# Patient Record
Sex: Female | Born: 1965 | Race: White | Hispanic: No | Marital: Married | Smoking: Former smoker
Health system: Southern US, Community
[De-identification: ages and names within clinical notes are randomized; demographics above are authoritative.]

## PROBLEM LIST (undated history)

## (undated) DIAGNOSIS — F419 Anxiety disorder, unspecified: Secondary | ICD-10-CM

## (undated) DIAGNOSIS — F32A Depression, unspecified: Secondary | ICD-10-CM

## (undated) DIAGNOSIS — K219 Gastro-esophageal reflux disease without esophagitis: Secondary | ICD-10-CM

## (undated) DIAGNOSIS — I10 Essential (primary) hypertension: Secondary | ICD-10-CM

## (undated) DIAGNOSIS — M1990 Unspecified osteoarthritis, unspecified site: Secondary | ICD-10-CM

## (undated) DIAGNOSIS — T7840XA Allergy, unspecified, initial encounter: Secondary | ICD-10-CM

## (undated) HISTORY — PX: WISDOM TOOTH EXTRACTION: SHX21

## (undated) HISTORY — DX: Gastro-esophageal reflux disease without esophagitis: K21.9

## (undated) HISTORY — DX: Allergy, unspecified, initial encounter: T78.40XA

## (undated) HISTORY — DX: Anxiety disorder, unspecified: F41.9

## (undated) HISTORY — DX: Depression, unspecified: F32.A

---

## 1998-10-21 ENCOUNTER — Other Ambulatory Visit: Admission: RE | Admit: 1998-10-21 | Discharge: 1998-10-21 | Payer: Self-pay | Admitting: Obstetrics and Gynecology

## 1999-03-10 ENCOUNTER — Other Ambulatory Visit: Admission: RE | Admit: 1999-03-10 | Discharge: 1999-03-10 | Payer: Self-pay | Admitting: Obstetrics and Gynecology

## 1999-06-07 ENCOUNTER — Encounter (INDEPENDENT_AMBULATORY_CARE_PROVIDER_SITE_OTHER): Payer: Self-pay

## 1999-06-07 ENCOUNTER — Inpatient Hospital Stay (HOSPITAL_COMMUNITY): Admission: AD | Admit: 1999-06-07 | Discharge: 1999-06-09 | Payer: Self-pay | Admitting: Obstetrics and Gynecology

## 1999-07-10 ENCOUNTER — Other Ambulatory Visit: Admission: RE | Admit: 1999-07-10 | Discharge: 1999-07-10 | Payer: Self-pay | Admitting: Obstetrics and Gynecology

## 1999-11-15 ENCOUNTER — Other Ambulatory Visit: Admission: RE | Admit: 1999-11-15 | Discharge: 1999-11-19 | Payer: Self-pay | Admitting: Psychiatry

## 1999-11-20 ENCOUNTER — Inpatient Hospital Stay (HOSPITAL_COMMUNITY): Admission: AD | Admit: 1999-11-20 | Discharge: 1999-11-23 | Payer: Self-pay | Admitting: Psychiatry

## 2000-05-27 ENCOUNTER — Emergency Department (HOSPITAL_COMMUNITY): Admission: EM | Admit: 2000-05-27 | Discharge: 2000-05-28 | Payer: Self-pay | Admitting: Emergency Medicine

## 2002-10-16 ENCOUNTER — Ambulatory Visit (HOSPITAL_COMMUNITY): Admission: RE | Admit: 2002-10-16 | Discharge: 2002-10-16 | Payer: Self-pay

## 2002-10-16 ENCOUNTER — Encounter: Payer: Self-pay | Admitting: *Deleted

## 2004-01-22 ENCOUNTER — Inpatient Hospital Stay (HOSPITAL_COMMUNITY): Admission: EM | Admit: 2004-01-22 | Discharge: 2004-01-26 | Payer: Self-pay | Admitting: Emergency Medicine

## 2004-02-09 ENCOUNTER — Ambulatory Visit: Payer: Self-pay | Admitting: Family Medicine

## 2004-02-15 ENCOUNTER — Ambulatory Visit: Payer: Self-pay | Admitting: Family Medicine

## 2004-02-15 ENCOUNTER — Ambulatory Visit: Payer: Self-pay | Admitting: *Deleted

## 2004-02-22 ENCOUNTER — Ambulatory Visit: Payer: Self-pay | Admitting: Family Medicine

## 2004-03-09 ENCOUNTER — Ambulatory Visit: Payer: Self-pay | Admitting: Internal Medicine

## 2004-04-25 ENCOUNTER — Ambulatory Visit: Payer: Self-pay | Admitting: Family Medicine

## 2004-04-27 ENCOUNTER — Ambulatory Visit: Payer: Self-pay | Admitting: Family Medicine

## 2004-05-02 ENCOUNTER — Ambulatory Visit: Payer: Self-pay | Admitting: Family Medicine

## 2004-05-18 ENCOUNTER — Ambulatory Visit: Payer: Self-pay | Admitting: Family Medicine

## 2004-05-25 ENCOUNTER — Ambulatory Visit: Payer: Self-pay | Admitting: Family Medicine

## 2004-06-06 ENCOUNTER — Ambulatory Visit: Payer: Self-pay | Admitting: Family Medicine

## 2004-06-27 ENCOUNTER — Ambulatory Visit: Payer: Self-pay | Admitting: Family Medicine

## 2005-10-05 ENCOUNTER — Inpatient Hospital Stay (HOSPITAL_COMMUNITY): Admission: AD | Admit: 2005-10-05 | Discharge: 2005-10-05 | Payer: Self-pay | Admitting: Gynecology

## 2005-10-05 ENCOUNTER — Ambulatory Visit: Payer: Self-pay | Admitting: Obstetrics and Gynecology

## 2005-10-10 ENCOUNTER — Inpatient Hospital Stay (HOSPITAL_COMMUNITY): Admission: EM | Admit: 2005-10-10 | Discharge: 2005-10-13 | Payer: Self-pay | Admitting: Obstetrics and Gynecology

## 2005-10-10 ENCOUNTER — Ambulatory Visit: Payer: Self-pay | Admitting: Gynecology

## 2006-01-28 ENCOUNTER — Emergency Department (HOSPITAL_COMMUNITY): Admission: EM | Admit: 2006-01-28 | Discharge: 2006-01-28 | Payer: Self-pay | Admitting: Emergency Medicine

## 2006-06-21 ENCOUNTER — Emergency Department (HOSPITAL_COMMUNITY): Admission: EM | Admit: 2006-06-21 | Discharge: 2006-06-22 | Payer: Self-pay | Admitting: Emergency Medicine

## 2006-07-01 ENCOUNTER — Emergency Department (HOSPITAL_COMMUNITY): Admission: EM | Admit: 2006-07-01 | Discharge: 2006-07-02 | Payer: Self-pay | Admitting: *Deleted

## 2007-03-05 ENCOUNTER — Emergency Department (HOSPITAL_COMMUNITY): Admission: EM | Admit: 2007-03-05 | Discharge: 2007-03-06 | Payer: Self-pay | Admitting: Emergency Medicine

## 2007-07-01 ENCOUNTER — Inpatient Hospital Stay (HOSPITAL_COMMUNITY): Admission: EM | Admit: 2007-07-01 | Discharge: 2007-07-03 | Payer: Self-pay | Admitting: Emergency Medicine

## 2007-07-03 ENCOUNTER — Ambulatory Visit: Payer: Self-pay | Admitting: Psychiatry

## 2007-07-19 ENCOUNTER — Inpatient Hospital Stay (HOSPITAL_COMMUNITY): Admission: EM | Admit: 2007-07-19 | Discharge: 2007-07-22 | Payer: Self-pay | Admitting: Emergency Medicine

## 2008-04-12 ENCOUNTER — Ambulatory Visit (HOSPITAL_COMMUNITY): Admission: RE | Admit: 2008-04-12 | Discharge: 2008-04-12 | Payer: Self-pay | Admitting: Family Medicine

## 2008-12-10 ENCOUNTER — Inpatient Hospital Stay (HOSPITAL_COMMUNITY): Admission: AD | Admit: 2008-12-10 | Discharge: 2008-12-12 | Payer: Self-pay | Admitting: Obstetrics and Gynecology

## 2008-12-28 ENCOUNTER — Emergency Department (HOSPITAL_COMMUNITY): Admission: EM | Admit: 2008-12-28 | Discharge: 2008-12-28 | Payer: Self-pay | Admitting: Emergency Medicine

## 2009-01-03 ENCOUNTER — Ambulatory Visit: Payer: Self-pay | Admitting: Psychiatry

## 2009-01-03 ENCOUNTER — Inpatient Hospital Stay (HOSPITAL_COMMUNITY): Admission: EM | Admit: 2009-01-03 | Discharge: 2009-01-07 | Payer: Self-pay | Admitting: Psychiatry

## 2009-01-03 ENCOUNTER — Emergency Department (HOSPITAL_COMMUNITY): Admission: EM | Admit: 2009-01-03 | Discharge: 2009-01-04 | Payer: Self-pay | Admitting: Emergency Medicine

## 2009-08-07 ENCOUNTER — Emergency Department (HOSPITAL_COMMUNITY): Admission: EM | Admit: 2009-08-07 | Discharge: 2009-08-08 | Payer: Self-pay | Admitting: Emergency Medicine

## 2010-06-05 LAB — BASIC METABOLIC PANEL
Chloride: 106 mEq/L (ref 96–112)
GFR calc Af Amer: >60 mL/min (ref >60)
Glucose, Bld: 216 mg/dL — ABNORMAL HIGH (ref 70–99)
Potassium: 3.6 mEq/L (ref 3.5–5.1)

## 2010-06-05 LAB — DIFFERENTIAL
Basophils Absolute: 0 10*3/uL (ref 0.0–0.1)
Eosinophils Absolute: 0 10*3/uL (ref 0.0–0.7)
Eosinophils Relative: 1 % (ref 0–5)
Monocytes Absolute: 0.4 10*3/uL (ref 0.1–1.0)
Neutrophils Relative %: 70 % (ref 43–77)

## 2010-06-05 LAB — CBC
HCT: 38.7 % (ref 36.0–46.0)
MCHC: 33.3 g/dL (ref 30.0–36.0)
Platelets: 190 10*3/uL (ref 150–400)

## 2010-06-22 LAB — CBC
HCT: 38.8 % (ref 36.0–46.0)
HCT: 42.9 % (ref 36.0–46.0)
HCT: 45.2 % (ref 36.0–46.0)
Hemoglobin: 15.5 g/dL — ABNORMAL HIGH (ref 12.0–15.0)
MCHC: 33.7 g/dL (ref 30.0–36.0)
MCHC: 34 g/dL (ref 30.0–36.0)
MCHC: 34.4 g/dL (ref 30.0–36.0)
MCV: 96.8 fL (ref 78.0–100.0)
MCV: 97.3 fL (ref 78.0–100.0)
MCV: 95.9 fL (ref 78.0–100.0)
MCV: 97.1 fL (ref 78.0–100.0)
Platelets: 95 10*3/uL — ABNORMAL LOW (ref 150–400)
Platelets: 238 10*3/uL (ref 150–400)
RBC: 3.99 MIL/uL (ref 3.87–5.11)
RDW: 12.5 % (ref 11.5–15.5)
RDW: 12.6 % (ref 11.5–15.5)
WBC: 9.1 10*3/uL (ref 4.0–10.5)
WBC: 20 10*3/uL — ABNORMAL HIGH (ref 4.0–10.5)

## 2010-06-22 LAB — COMPREHENSIVE METABOLIC PANEL
ALT: 36 U/L — ABNORMAL HIGH (ref 0–35)
AST: 51 U/L — ABNORMAL HIGH (ref 0–37)
Albumin: 3.7 g/dL (ref 3.5–5.2)
Alkaline Phosphatase: 159 U/L — ABNORMAL HIGH (ref 39–117)
BUN: 8 mg/dL (ref 6–23)
BUN: 6 mg/dL (ref 6–23)
Calcium: 9.1 mg/dL (ref 8.4–10.5)
Calcium: 8.4 mg/dL (ref 8.4–10.5)
Chloride: 100 mEq/L (ref 96–112)
Chloride: 95 mEq/L — ABNORMAL LOW (ref 96–112)
Creatinine, Ser: 0.48 mg/dL (ref 0.4–1.2)
Glucose, Bld: 105 mg/dL — ABNORMAL HIGH (ref 70–99)
Potassium: 3.9 mEq/L (ref 3.5–5.1)
Sodium: 138 mEq/L (ref 135–145)
Sodium: 131 mEq/L — ABNORMAL LOW (ref 135–145)
Total Bilirubin: 1.5 mg/dL — ABNORMAL HIGH (ref 0.3–1.2)
Total Bilirubin: 0.9 mg/dL (ref 0.3–1.2)
Total Protein: 6.9 g/dL (ref 6.0–8.3)
Total Protein: 7.8 g/dL (ref 6.0–8.3)

## 2010-06-22 LAB — DIFFERENTIAL
Basophils Absolute: 0 10*3/uL (ref 0.0–0.1)
Basophils Relative: 0 % (ref 0–1)
Basophils Relative: 0 % (ref 0–1)
Eosinophils Absolute: 0.3 10*3/uL (ref 0.0–0.7)
Eosinophils Absolute: 0 10*3/uL (ref 0.0–0.7)
Eosinophils Relative: 0 % (ref 0–5)
Lymphocytes Relative: 22 % (ref 12–46)
Lymphocytes Relative: 13 % (ref 12–46)
Monocytes Absolute: 0.3 10*3/uL (ref 0.1–1.0)
Monocytes Relative: 3 % (ref 3–12)
Neutrophils Relative %: 64 % (ref 43–77)

## 2010-06-22 LAB — BASIC METABOLIC PANEL
BUN: 3 mg/dL — ABNORMAL LOW (ref 6–23)
CO2: 27 mEq/L (ref 19–32)
Chloride: 101 mEq/L (ref 96–112)
Creatinine, Ser: 0.42 mg/dL (ref 0.4–1.2)

## 2010-06-22 LAB — URINALYSIS, ROUTINE W REFLEX MICROSCOPIC
Bilirubin Urine: NEGATIVE
Specific Gravity, Urine: 1.009 (ref 1.005–1.030)
Urobilinogen, UA: 0.2 mg/dL (ref 0.0–1.0)

## 2010-06-22 LAB — RAPID URINE DRUG SCREEN, HOSP PERFORMED
Barbiturates: NOT DETECTED
Cocaine: NOT DETECTED
Opiates: NOT DETECTED
Opiates: NOT DETECTED
Tetrahydrocannabinol: NOT DETECTED

## 2010-06-22 LAB — ETHANOL
Alcohol, Ethyl (B): 411 mg/dL (ref 0–10)
Alcohol, Ethyl (B): 402 mg/dL (ref 0–10)

## 2010-06-22 LAB — URINE MICROSCOPIC-ADD ON

## 2010-06-22 LAB — PREGNANCY, URINE: Preg Test, Ur: NEGATIVE

## 2010-06-23 LAB — CBC
HCT: 33.1 % — ABNORMAL LOW (ref 36.0–46.0)
Hemoglobin: 11.4 g/dL — ABNORMAL LOW (ref 12.0–15.0)
MCHC: 34.6 g/dL (ref 30.0–36.0)
MCHC: 34.7 g/dL (ref 30.0–36.0)
MCV: 99.7 fL (ref 78.0–100.0)
Platelets: 96 10*3/uL — ABNORMAL LOW (ref 150–400)
RBC: 3.17 MIL/uL — ABNORMAL LOW (ref 3.87–5.11)
RDW: 13.3 % (ref 11.5–15.5)

## 2010-08-01 NOTE — H&P (Signed)
NAMERECHY, BOST              ACCOUNT NO.:  [id number]   MEDICAL RECORD NO.:  [id number]          PATIENT TYPE:  EMS   LOCATION:  ED                           FACILITY:  [facility]   PHYSICIAN:  Eduard Clos, MDDATE OF BIRTH:  07/12/1965   DATE OF ADMISSION:  07/01/2007  DATE OF DISCHARGE:                              HISTORY & PHYSICAL   History obtained from patient, ER physician and medical records.   CHIEF COMPLAINT:  The patient was brought in because he was unconscious.   HISTORY OF PRESENT ILLNESS:  A 45 year old female with a known history  of chronic alcoholism and cigarette smoking, who was found in a motel  unconscious and was brought into the ER by the police.  Once the patient  was in the ER, the patient started regaining consciousness, but was  still confused and fatigued, so was admitted for further observation.  The patient in the ER had a CAT scan of the head noted and chest x-rays  done, which are all within acceptable limits and they do not show  anything acute.  The patient at this time is completely oriented with no  focal deficits.  She states that she usually lives with her family and  her husband, but had gone to a motel for which she is not able to give  an exact explanation why she went to the motel.  Presently denies any  headache, denies any weakness of limbs, chest pain, shortness of breath,  abdominal pain, nausea, vomiting.  Denies any fever or chills,  discharges or diarrhea.   PAST MEDICAL HISTORY:  1. Chronic alcoholism.  2. Cigarette smoking.   PAST SURGICAL HISTORY:  None.   MEDICATIONS PRIOR TO ADMISSION:  None.  The patient states she was  prescribed some antidepressant but is off those medications and does not  have any suicidal ideations.   ALLERGIES:  SULFA.   SOCIAL HISTORY:  The patient states she lives with her husband.  She  smokes cigarettes, drinks alcohol.  Denies any illegal drug abuse.  Had  been advised strongly to  quit smoking and drinking alcohol.   FAMILY HISTORY:  Nothing contributory.   REVIEW OF SYSTEMS:  As per history of present illness.  Nothing else  significant.   PHYSICAL EXAMINATION:  GENERAL:  The patient examined at bedside.  Not  in acute distress.  She is alert, awake and oriented to time, place and  person.  VITAL SIGNS:  Blood pressure 101/70, pulse 90 per minute, temperature  98.5, respirations 15 per minute.  O2 sat 96%.  HEENT:  Anicteric.  No pallor.  PERRLA positive.  SKIN:  There is no obvious dermatic lesion seen.  CHEST:  Bilateral air present.  No rhonchi.  HEART:  S1-S2 heard.  ABDOMEN:  Soft, nontender.  Bowel sounds heard.  No guarding, no  rigidity, no discoloration.  CNS:  Alert, awake, oriented to time, place and person.  MOTOR:  Upper and lower extremities 5/5.  EXTREMITIES:  Peripheral pulses felt.  No edema.   LABORATORY DATA:  Chest x-ray, nothing acute.  CAT scan of  the head,  nothing acute.  EKG; normal sinus rhythm with no acute ST-T changes.  Tox screen negative.  Urine pregnancy negative.  CBC - WBC 6.8,  hemoglobin 14, hematocrit 41.5, platelets 157, neutrophils 68%.  PT/INR  12.9 and 1.  CMP; sodium 138, potassium 3.9, chloride 101, carbon  dioxide 24, glucose 95, BUN 2, creatinine 0.49, AST 49, ALT 33, alkaline  phosphatase 88, cold agglutinin 6.7, calcium 8.4, albumin 4.  Urine  pregnancy negative.  Urine tox negative.  Acetaminophen level less than  10.  Salicylates less than 4.  Alcohol 492.  UA negative for ketones,  blood, protein, leukocytes.   ASSESSMENT:  1. Altered mental status secondary to alcohol, essentially resolved.  2. Chronic history of alcoholism and cigarette smoking.  3. History of depression, off medication with no suicidal ideation.   PLAN:  Admission to [facility] to medical floor.  Will place the  patient on Ativan for alcohol withdrawal protocol, thiamine IV and folic  acid IV.  Will start on a heart-healthy  diet.  Will follow up cardiac  enzymes, check TSH  and lipid panel.  Will observe the patient for 24  hours.  Will get a social service consult.  Also substance abuse  counseling along with tobacco cessation counseling.      Eduard Clos, MD  Electronically Signed     ANK/MEDQ  D:  07/01/2007  T:  07/01/2007  Job:  [phone]

## 2010-08-01 NOTE — Discharge Summary (Signed)
NAMEALAYAH, KNOUFF              ACCOUNT NO.:  [id number]   MEDICAL RECORD NO.:  [id number]          PATIENT TYPE:  INP   LOCATION:  1503                         FACILITY:  [facility]   PHYSICIAN:  Hettie Holstein, D.O.    DATE OF BIRTH:  12-08-1965   DATE OF ADMISSION:  07/01/2007  DATE OF DISCHARGE:  07/03/2007                               DISCHARGE SUMMARY   PRIMARY CARE PHYSICIAN:  [facility].   FINAL DIAGNOSIS:  Alteration in mental status, secondary to alcohol.   SECONDARY DIAGNOSES:  1. Chronic alcohol abuse and tobacco abuse.  2. History of depression; no suicidal ideation.   MEDICATIONS ON DISCHARGE:  The patient should resume her prior  medication with Prozac and also initiate Librium 25 mg q.i.d. -- only as  needed for agitation, anxiety (dispense #20; and a daily multivitamin.   DISPOSITION:  At present, Ms. Chapa is stable without suicidal  ideation, status post inpatient psychiatry evaluation; who strongly  recommended inpatient detoxification.  However, Ms. Philson has  continued to decline.   At this juncture, she is felt to be stable for discharge.   HISTORY OF PRESENTING ILLNESS:  For full details please refer to the H&P  as dictated by Dr. Toniann Fail.  The patient is a 45 year old female with  known history of chronic alcoholism and tobacco abuse.  She was found in  a motel unconscious and was brought to the emergency department by  police.  Once the patient arrived in the ER, she started regaining  consciousness.  She had some confusion, was fatigued.  She was admitted  for further observation.  In the emergency department a CT had been  performed, as well as chest x-rays -- which were all within normal  limits.  In any event, she was noted to be completely oriented without  focal deficits upon hospitalist assessment.   HOSPITAL COURSE:  The patient was admitted, placed on withdrawal  precautions and a psychiatric evaluation was sought.  She was seen in  consultation by Dr. Electa Sniff; for full details and recommendations, refer  to his full consultative note.  Again, inpatient detox was recommended,  but Mrs. Moye declined.  We are requesting social worker to provide  her with all the necessary information, to get on a program for alcohol  dependency.  She states that she has every intent of pursuing this in  the outpatient setting.   LABORATORY STUDIES:  (Most recent to discharge) Urine culture was  negative.  TSH was 1.527.  Lipid profile revealed:  Total cholesterol  188, LDL of 80, HDL 98.  Urine pregnancy test was negative.  Urine drug  screen was also negative.  Alcohol level upon arrival was 492 mg/dL      Hettie Holstein, D.O.  Electronically Signed     ESS/MEDQ  D:  07/03/2007  T:  07/03/2007  Job:  [id number]

## 2010-08-01 NOTE — H&P (Signed)
[name], [name]              ACCOUNT NO.:  [id number]   MEDICAL RECORD NO.:  [id number]          PATIENT TYPE:  INP   LOCATION:  1223                         FACILITY:  [facility]   PHYSICIAN:  Della Goo, M.D. DATE OF BIRTH:  1966-01-01   DATE OF ADMISSION:  07/19/2007  DATE OF DISCHARGE:                              HISTORY & PHYSICAL   DATE OF ADMISSION:  Jul 19, 2007   PRIMARY CARE PHYSICIAN:  Unassigned.   CHIEF COMPLAINT:  Right arm shaking.   HISTORY OF PRESENT ILLNESS:  This is a 45 year old female with a history  of heavy alcoholism presenting to the emergency department with  complaints of alcohol withdrawal and increased right arm shaking.  The  patient reports that her right arm shakes and tremors only when she is  in alcohol withdrawal.  She reports drinking heavily and has done so for  approximately 8-9 years.  She reports drinking beers and does not  remember how many.  She only reports that she drinks too much.  The  patient does state that she wants to stop drinking.  In the emergency  department the patient was evaluated and found to have an alcohol level  at 485.  Her speech was hesitant but clear and not dysarthric.   PAST MEDICAL HISTORY:  None other than alcoholism.   MEDICATIONS:  None.   PAST SURGICAL HISTORY:  None.   ALLERGIES:  To SULFA.   SOCIAL HISTORY:  The patient reports smoking cigarettes occasionally and  the alcohol history has been heavy.  She reports starting to drink about  8 years ago after the birth of her last child.  She is married and has  three children.  Her last child lives with her mother.   FAMILY HISTORY:  The patient denies any hypertension, diabetes, coronary  artery disease or cancer in her family.  She also denies any alcoholism  in her family that she knows of.   PHYSICAL EXAMINATION FINDINGS:  This is a thin 45 year old, well-  nourished, well-developed female in no discomfort or acute distress.  VITAL SIGNS:   Temperature 98.0, blood pressure 136/102, heart rate 98,  respirations 20, O2 saturation 99%.  HEENT:  Normocephalic, atraumatic.  Pupils are reactive to light  bilaterally.  Extraocular muscles are intact.  There is no scleral  icterus.  Unable to visualize fundi.  Oropharynx is dry oral mucosa with  white tongue exudate.  NECK:  Supple full range of motion.  No thyromegaly, adenopathy,  jugulovenous distention.  CARDIOVASCULAR:  Regular rate and rhythm.  No murmurs, gallops or rubs.  LUNGS:  Clear to auscultation bilaterally.  ABDOMEN:  Positive bowel sounds, soft, nontender, nondistended.  There  is no hepatosplenomegaly on examination.  EXTREMITIES:  Without cyanosis, clubbing or edema.  NEUROLOGIC EXAMINATION:  Nonfocal.   LABORATORY STUDIES:  White blood cell count 4.9, hemoglobin 14.8,  hematocrit 41.6, platelets 56.  Sodium 125, potassium 3.4, chloride 90,  bicarb 24, BUN 1.0, creatinine 0.56 and glucose 126.  Urinalysis  negative.  Urine pregnancy test negative.  Urine drug screen negative.   ASSESSMENT:  A  45 year old female being admitted with:  1. Alcohol intoxication.  2. Alcoholism.  3. Hyponatremia.  4. Thrombocytopenia secondary to #2   PLAN:  The patient will be admitted to a step-down ICU area for cardiac  and pulmonary monitoring.  IV fluids have been ordered and her  electrolytes will be monitored.  The patient will be placed on the  Librium alcohol withdrawal protocol.  P.o. and IV p.r.n. Ativan has been  ordered as well.  Liver function tests will be checked.  DVT and GI  prophylaxis have been ordered in this patient.  Behavioral  Health/psychiatric consultations will also be placed while the patient  is hospitalized; also, case management.      Della Goo, M.D.  Electronically Signed     HJ/MEDQ  D:  07/20/2007  T:  07/20/2007  Job:  [id number]
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
## 2010-08-01 NOTE — Discharge Summary (Signed)
NAMEEMILENE, [name]              ACCOUNT NO.:  [id number]   MEDICAL RECORD NO.:  [id number]          PATIENT TYPE:  INP   LOCATION:  1515                         [facility]   PHYSICIAN:  Altha Harm, MDDATE OF BIRTH:  1965/04/01   DATE OF ADMISSION:  07/19/2007  DATE OF DISCHARGE:  07/22/2007                               DISCHARGE SUMMARY   DISCHARGE DISPOSITION:  Home.   FINAL DISCHARGE DIAGNOSES:  1. Delirium tremens.  2. Alcoholism.  3. Hyponatremia.  4. Pancytopenia.   DISCHARGE MEDICATIONS:  1. Librium 25 mg p.o. daily.  2. Potassium 40 mEq p.o. daily.  3. Thiamine 100 mg p.o. daily.  4. Folate 1 mg p.o. daily.   CONSULTANTS:  None.   PROCEDURES:  None.   DIAGNOSTIC STUDIES:  CT head without contrast done on admission which  shows no acute intracranial pathology.   LABORATORY STUDIES:  At the time of discharge, the patient has a white  blood cell count of 2.9, hemoglobin of 12.4, hematocrit of 35.3,  platelet count of 44,  which has been stable.   CODE STATUS:  Full code.   ALLERGIES:  SULFA.   PRIMARY CARE PHYSICIAN:  The patient does not have a primary care  physician at this time.   CHIEF COMPLAINT:  Right arm shaking.   HISTORY OF PRESENT ILLNESS:  Please see the H&P dictated by Dr. Lovell Sheehan  for details of the HPI; however, in short, this is a 45 year old female  with a history of heavy alcoholism presenting with alcohol withdrawal.   HOSPITAL COURSE:  The patient was admitted and monitored in a step-down  setting.  Subsequent to that, the patient was started on Librium  protocol and is completing the end of her Librium protocol for alcohol  detoxification.   The patient has been counseled on the need for alcohol abstinence and  states that while she is willing to abstain from alcohol and to follow  with an AA program, she is not willing to go into inpatient rehab at  this time and is requesting discharge.   Pancytopenia:  The  patient has had a pancytopenia which has been  persistent throughout her hospital stay.  Under the care of Dr. Olena Leatherwood,  she had a discussion with her.  It was determined by Dr. Olena Leatherwood that  the pancytopenia was secondary to her alcoholism, and no further  investigations were pursued in the hospital.  I have discussed with the  patient that she needs to abstain from alcohol and that she should  follow up with a primary care physician of her choice to further  investigate the pancytopenia and to follow with her blood counts.   Hypokalemia:  The patient had a low potassium while hospitalized,  which  is being replenished orally.  The patient is being discharged home on  potassium supplementation.  Her magnesium was not checked while she was  in the hospital; however, I would suspect that the patient's magnesium  is also low.  However, again, the patient is requesting discharge, and,  given the fact that she is clinically stable, I will  not hold her in the  hospital just to check a magnesium level.  I am going to go ahead and  put her on magnesium supplementation with mag oxide 400 mg p.o. b.i.d.  given her alcoholism and ask her to check with a primary care physician  to further check her electrolytes.   At the time of discharge, the patient is alert and oriented x3.  She has  no signs of withdrawal at this time.  Temperature is 98.2, heart rate  69,  blood pressure 107/70, and O2 sats are 96% on room air with a  respiratory rate of 18.   DIETARY RESTRICTIONS:  The patient has no dietary restrictions, and she  has been counseled to abstain from alcohol use.   PHYSICAL RESTRICTIONS:  None.   FOLLOWUP:  The patient should follow up with a primary care physician of  her choice to further investigate her pancytopenia and to follow her  electrolytes.      Altha Harm, MD  Electronically Signed     MAM/MEDQ  D:  07/22/2007  T:  07/22/2007  Job:  [id number]

## 2010-08-04 NOTE — Op Note (Signed)
[name], [name]              ACCOUNT NO.:  [id number]   MEDICAL RECORD NO.:  [id number]          PATIENT TYPE:  INP   LOCATION:  0454                         [facility]AC[facility]   PHYSICIAN:  Petra Kuba, M.D.    DATE OF BIRTH:  06-12-1965   DATE OF PROCEDURE:  01/23/2004  DATE OF DISCHARGE:                                 OPERATIVE REPORT   PROCEDURE:  Esophagogastroduodenoscopy.   INDICATIONS:  Upper GI bleeding. Consent was signed after risks, benefits,  methods, and options thoroughly discussed last night and before any sedate  given today.   MEDICINES USED:  Demerol 80, Versed 9.   PROCEDURE NOTE:  The video endoscope was inserted by direct vision. Proximal  and mid esophagus were normal. In the distal esophagus was a small hiatal  hernia and a well healed probably Mallory-Weiss tear versus GE junction  linear ulceration. There was some friability at the GE junction but no signs  of active bleeding. Scope passed into the stomach and advanced through a  normal antrum and pylorus into the duodenal bulb pertinent for minimal  bulbitis and around C loop to a normal second portion of the duodenum. No  blood was seen distally. As we withdrew back to the bulb, the C loop did  have some friability, but with washing and suctioning, no underlying lesion  was seen and no active bleeding was seen once the fresh blood from probable  scope trauma was washed off. Scope was withdrawn back to the stomach and  retroflexed. The angularis, cardia, fundus, and lesser and greater curve  were normal on retroflexed visualization. Straight visualization of the  stomach did not reveal any additional findings. Scope was slowly withdrawn  back through her esophagus which confirmed above findings. The GE was also  washed and watched and could not be made to bleed. It did have the  appearance actually of more like a healed Mallory-Weiss tear. We went ahead  and repeated the endoscopy in total, just to  make sure no other lesions were  missed, and none were seen. Air and water were suctioned from the stomach.  Scope was removed. The patient tolerated the procedure well. There was no  obvious or immediate complication.   ENDOSCOPIC DIAGNOSES:  1.  Small hiatal hernia with a well healed Mallory-Weiss tear versus      gastroesophageal junction linear ulceration.  2.  Mild bulbitis and friability.  3.  Otherwise normal esophagogastroduodenoscopy.   PLAN:  Okay to leave unit. Pump inhibitors. Alcohol rehab. Re-EGD p.r.n.  Slowly advance diet. If her belly pain continues, consider an ultrasound. No  aspirin or nonsteroidals long term.      MEM/MEDQ  D:  01/23/2004  T:  01/23/2004  Job:  [id number]

## 2010-08-04 NOTE — Discharge Summary (Signed)
[facility]  Patient:    [name], [name]                       MRN: [id number] Adm. Date:  [id number] Disc. Date: [id number] Attending:  Marlyn Corporal Fabmy                           Discharge Summary  ADMISSION DIAGNOSES: Axis I:    1. Major depression, single episode.            2. Alcohol dependence. Axis II:   Deferred. Axis III:  Status post childbirth five months ago. Axis IV:   Severe (primary support group and social environment). Axis V:    Global Assessment of Functioning 35 on admission; highest in the            past year being 75.  DISCHARGE DIAGNOSES: Axis I:    1. Major depression, single episode.            2. Alcohol dependence. Axis II:   Deferred. Axis III:  Status post childbirth five months ago. Axis IV:   Severe (related to primary support group and social environment). Axis V:    Global Assessment of Functioning 35 on admission, 75 on discharge.  BRIEF HISTORY:  The patient is a 45 year old married Caucasian female who provides a history of depression and episodic alcohol abuse.  At the time of her admission, the patient had a blood alcohol level of 445 mg%.  On admission, the patient endorsed symptoms of depression including anhedonia, difficulty concentrating, lethargy, episodic suicidal thinking without plan and difficulty coping with everyday activities.  Sleep had been poor and she has troubled early morning awakenings.  She also has been anorexic and has lost several pounds in weight.  Given the magnitude of her depression and her escalating alcohol abuse, patient was admitted for detoxification and management of her depression.  PERTINENT PHYSICAL AND LABORATORY DATA:  Physical examination was done at the emergency room prior to her transfer here.  Thyroid studies were unremarkable.  COURSE IN HOSPITAL:  The patient was detoxified uneventfully on a Librium protocol.  She did not show evidence of either major or minor  withdrawal symptoms.  Patient had been on Prozac for a week without side effects and the dose of the Prozac was increased to 40 mg daily.  Wellbutrin was added after obtaining informed consent.  During the course of her hospitalization, I talk to her at length about her history of binge drinking over the last two months as it relates to her depression.  In turn, the patient stated that she had her baby and, two weeks later, she had her kitchen and living room redecorated and her drinking spiraled out of control because of the stress associated with living in her current space.  By the time of her discharge, her depression was resolving.  CONDITION ON DISCHARGE:  Patient was optimally improved, fully detoxified without complications and she was eating and sleeping well.  Her relationship with her husband had improved and she was much more optimistic about their future together.  Her thinking was clear and coherent and her affect was full.  MEDICATION AND FOLLOW-UP:  Patient was discharged with prescriptions for Wellbutrin SR 150 mg q.12h., Prozac 40 mg daily and Restoril 30 mg q.h.s. p.r.n.  She was advised not to drink or use drugs.  She was to follow with AA. An appointment was made  for her at the [facility] outpatient clinic to begin November 24, 1999. DD:  12/25/99 TD:  12/25/99 Job: [id number]

## 2010-08-04 NOTE — H&P (Signed)
NAMEGUINEVERE, STEPHENSON              ACCOUNT NO.:  [id number]   MEDICAL RECORD NO.:  [id number]          PATIENT TYPE:  INP   LOCATION:  0163                         FACILITY:  [facility]   PHYSICIAN:  Gertha Calkin, M.D.DATE OF BIRTH:  12-May-1965   DATE OF ADMISSION:  01/22/2004  DATE OF DISCHARGE:                                HISTORY & PHYSICAL   Patient is unassigned patient.   CHIEF COMPLAINT:  Coffee-grounds emesis last night and hematemesis.   PRESENT ILLNESS:  This is a 45 year old Caucasian female who has  longstanding history of alcoholism, tobacco use and probable history of  depression who states that she started throwing up last night bright red  blood.  She states that she has been up all night having emesis  approximately every hour.  This morning she was dropped off by her husband  and while in the ER has mostly been dry heaves but also some coffee-grounds  emesis was present.  She states that she has been lightheaded ever since her  first few episodes last night.  She has not had any syncopal spells and has  lots of nausea.  She denies any previous similar symptoms.  She states that  her last alcoholic beverage was back Thursday evening.   PAST MEDICAL HISTORY:  Has not been followed by a primary care physician.  She does admit to alcohol use for approximately 15 years on and off and  tobacco use for approximately 4 years on and off.  She states she is smoking  a little less than half a pack a day currently.  She does state that she has  had blackouts before as well as had one DUI related to alcoholism.   FAMILY HISTORY:  Mother is alive at 73 with known hypercholesterolemia but  no known other medical issues.  She does have some siblings who are also  without any known medical issues.   SOCIAL HISTORY:  She is married but now estranged as of 2 days ago.  She  does have two kids ages 4-1/2 and 34 both daughters.  She states that she  has currently been kicked out  of her house by her husband and been told that  he will be filing a restraining order coming this Monday.   MEDICATIONS:  Only on Ortho Tri-Cyclen.   DRUG ALLERGIES:  She is allergic to SULFA which causes her to itch.   REVIEW OF SYSTEMS:  Positive for alcoholism, lightheadedness, hematemesis  and melanotic stools currently and in the past.  She states that she does  has been having depressive episodes and has been on several antidepressant  medication trials but has not continued for financial reasons.  Otherwise  she denies any fevers, chills, cough, sinus allergies, headaches, visual  changes.  She denies problems with chest pain/shortness of breath.  No  constipation/diarrhea.  No lower extremity swelling.  Other review of  systems negative.   PHYSICAL EXAMINATION:  VITAL SIGNS:  Temperature is 97.8, pulse is 71,  respirations 20, blood pressure 105/81.  Her lying blood pressure 123/93,  sitting was 117/79, and she refused standing  as she was starting to feel  lightheaded.  Her heart rate appropriately bumped from 106 to 141 from lying  to standing.  GENERAL:  In general this is an ill-appearing white female in mild distress.  She seems very tearful.  HEENT:  Some dried blood in her mouth, otherwise no cuts or bruises.  Tongue  without lacerations.  Oropharynx is without exudate.  Pupils equal and round  reactive to light.  Extraocular movements are intact.  NECK:  Supple without masses.  No JVP.  CHEST:  Clear to auscultation bilaterally with good air movement.  HEART:  Regular rhythm but tachycardic.  No appreciable murmurs, rubs, or  gallops.  ABDOMEN:  Soft, nontender, nondistended, without rebound or rigidity.  Positive bowel sounds.  No organomegaly appreciated.  EXTREMITIES:  No clubbing, cyanosis, or edema.  SKIN:  Without rashes or lesions.  NEUROLOGIC:  She is alert and oriented x3, cranial nerves are intact, and  she has no gross motor deficits.   LABORATORIES:   She has a white count of 15.7, hemoglobin 11.2, platelets of  250, MCV of 101.6.  Her sodium is 135, potassium of 3, chloride of 93,  bicarb of 26, glucose 191, BUN of 29 and creatinine of 0.9.  Her LFTs are  all within normal range.  Alcohol level is less than 5.  Gastric occult was  positive for blood and lipase and amylase levels were negative.   ASSESSMENT AND PLAN:  Problem 1. UPPER GASTROINTESTINAL BLEED.  We will ask  gastroenterology for consultation regarding EGD.  In the meantime we will  type and screen 2 units, fluid resuscitate her with normal saline, add a  proton pump inhibitor and antiemetics as needed, will be admitted to step  down for close observation at least for the next 23 hours.   Problem 2. ALCOHOL ABUSE.  Will write for a banana bag and have given as  needed orders for Ativan with any changes that would be consistent with  withdrawal symptoms.   Problem 3. HYPOKALEMIA.  We will replete.   Problem 4. HYPERGLYCEMIA.  We will follow for now.   Problem 5. TOBACCO USE/ABUSE.  We will offer counseling for cessation.   Problem 6. DEPRESSION.  We will get case manager involved to help patient  with finances in regards to long-term antidepressant medications.   Problem 7. SOCIAL CIRCUMSTANCES.  Given her current situation at home, we  will ask care manager to help with available resources in this situation.  She does state that she has family members but they are all in [place] and currently has no resources to I guess contact them.      JD/MEDQ  D:  01/22/2004  T:  01/22/2004  Job:  [id number]

## 2010-08-04 NOTE — Discharge Summary (Signed)
[name], [name]              ACCOUNT NO.:  [id number]   MEDICAL RECORD NO.:  [id number]          PATIENT TYPE:  INP   LOCATION:  0369                         FACILITY:  [facility]   PHYSICIAN:  Jonna L. Robb Matar, M.D.DATE OF BIRTH:  04-Jun-1965   DATE OF ADMISSION:  01/22/2004  DATE OF DISCHARGE:  01/26/2004                                 DISCHARGE SUMMARY   PRIMARY CARE PHYSICIAN:  [facility].   FINAL DIAGNOSES:  1.  Upper gastrointestinal bleed secondary to a Mallory-Weiss tear.  2.  Anemia secondary to blood loss.  3.  Hypokalemia.  4.  Thrombocytopenia secondary to alcoholism.  5.  Depressive disorder, not otherwise specified.   CONSULTATIONS:  1.  Gastroenterology, Dr. Ewing Schlein.  2.  Psychiatry, Dr. Jeanie Sewer.   PROCEDURE:  Esophagogastroduodenoscopy on November 6.   ALLERGIES:  SULFA.   CODE STATUS:  Full.   HISTORY:  This 45 year old Caucasian female with chronic alcoholism and  depression began to throw up bright red blood every hour.  Developed  lightheadedness without syncope.  She has had alcohol problems off and on  for 15 years.  Previous black outs and one DUI related to alcoholism.  Two  days ago, she was estranged from her husband.  She has been kicked out and  will be filing a restraining order.   PHYSICAL EXAMINATION:  VITAL SIGNS:  Initially showed a blood pressure of  123/93 lying down and 105/81 standing up.  Heart rate went from 106 to 141  with orthostatics.  GENERAL:  She appeared chronically ill and tearful.  HEENT:  Dried blood in the mouth.  ABDOMEN:  Nontender.  Nondistended.  No tremors.   Initial white count was 15.7, elevated MCV.  Potassium 3.0.  BUN 29.  Creatinine 0.9.   HOSPITAL COURSE:  The patient was hydrated, transfused 2 units of blood.  She was seen by Dr. Ewing Schlein, who put her on IV PPIs and did the EGD.  The  patient was repleted with potassium, magnesium, thiamine, vitamins, and  gradually improved.  Her platelet count was 105  and came up to 117.  Her  initial hemoglobin was 7.6.  It came up to 10.8 after transfusion.   Case manager and psychiatry were involved with the patient.  Dr. Jeanie Sewer  __________ .  I suggested she is a good candidate to start her off with  Remeron, Alcoholic's Anonymous, and eventually possibly Effexor.   DISPOSITION:  Patient will be discharged on omeprazole 20 mg b.i.d.,  Centrum, Remeron 7.5 mg q.h.s. x2 days, 15 q.h.s. x2 days, then 22.5 q.h.s.  Ativan 1 mg up to t.i.d. p.r.n.  She will be going with her mother to visit  a place to stay and then stay with her mother for two weeks.  When she comes  back, she is to get involved in AA.  She will have an appointment with both  [facility] and [facility].      JLB/MEDQ  D:  01/26/2004  T:  01/26/2004  Job:  [id number]   cc:   [facility]   [facility]

## 2010-08-04 NOTE — H&P (Signed)
[facility]  Patient:    [name], [name]                       MRN: [id number] Adm. Date:  [id number] Attending:  Marlyn Corporal Fabmy Dictator:   Johnella Moloney, N.P.                   Psychiatric Admission Assessment  IDENTIFYING INFORMATION:  Ms. Luedke is a 45 year old white female, admitted November 20, 1999, on a voluntary basis after drinking a fifth of vodka.  She was seen at [facility] Emergency Department and when going in she had a blood alcohol level of 445.  HISTORY OF PRESENT ILLNESS:  The patient is here for detox and also treatment of depression.  She has been drinking on and off, she said, for about two months.  She apparently is a binge drinker and drinks maybe two to three times a week.  The patient is somewhat vague and it is difficult to get a real clear picture from her.  She says she goes some for weeks i.e., three weeks when she does not drink.  She did start drinking yesterday, November 19, 1999, and probably drank about a fifth of vodka.  Her last drink was November 19, 1999. She probably consumed the fifth of liquor in about four to five hours.  She states that she apparently has been trying to hide the drinking from her husband.  Apparently, according to the patient, she is very depressed and drinking to treat the depression.  Apparently her husband recognized that she was intoxicated and took her to the emergency department.  The patient is vague as to what is going on.  She has been very depressed for five months, since the birth of her child five months ago.  She currently denies suicidal ideation, denies homicidal ideation.  Sleep has been poor.  Has trouble falling asleep with early morning awakening.  Appetite has been poor for one month.  She states she has had a hard time since she had her baby five months ago.  Her grandmother also died 06/11/01and she states that her house was being renovated about two or three  weeks after she had the baby and she was stuck in one room and could not use the kitchen.  She states her husband is a marital stress due to the drinking, that her husband just does not understand. She states, however, they plan to stay together.  She has been crying a lot, decreased energy, anhedonia.  She states she has a couple of friends but has not talked to them recently.  She states she has had trouble adjusting to the new baby, and two weeks after she delivered is when the kitchen was torn apart and renovation began, and again she complains of being cooped up in one room. She states that she did attend an AA meeting yesterday.  However, afterwards she got upset with her husband because he would not go to church with her and she did go to church with her mom, but it was too late and the service had ended.  She then apparently consumed a fifth or more of alcohol.  Her husband apparently reported that she had been sneaking into the bathroom drinking.  PAST PSYCHIATRIC HISTORY:  The patient denies any previous inpatient treatment.  She has seen Dr. Jodi Marble on one occasion.  She was on Zoloft for one month as prescribed by her OB/GYN doctor  who suspected postpartum depression.  That was stopped last week and she was started on a new medication.  She did see a counselor by the name of Jerral Bonito on two occasions.  She goes to AA occasionally.  PAST MEDICAL HISTORY:  The patient has no primary care doctor, goes to Urgent Care when she needs something.  She does have an OB/GYN doctor, Dr. Rana Snare.  She denies medical problems.  CURRENT MEDICATIONS: 1. Prozac 20 mg q.a.m. which was started last week. 2. Micronor birth control pills at bedtime.  DRUG ALLERGIES:  SULFA which makes her itch.  SOCIAL HISTORY:  The patient has been married for 10 years.  This is her first marriage.  She has two children, a 62-year-old daughter and a 73-month-old daughter.  Her parents lives in [place].  She has  one sister.  She states she is close to her family.  She completed college and graduated from [employer] with a BS in elementary education.  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  She denies any financial problems.  Her husband works.  FAMILY HISTORY:  None.  ALCOHOL AND DRUG HISTORY:  The patient states that she has been drinking since she was a teenager; however, she does not consider that she was a problem drinker up until two months ago and then she describes herself as a binge drinker now.  She denies substance abuse.  She states she smokes a half a pack of cigarettes a day and she has only been doing this for two weeks.  PHYSICAL EXAMINATION:  Positive physical findings:  Please see physical examination done at [facility] ED on November 19, 1999, and November 20, 1999.  Again, her blood alcohol level was 445.  Her white blood count was 11.4, hemoglobin 13.9.  Sodium was increased at 146, potassium increased at 3.2. She was given IVs at [facility] ED.  CURRENT MENTAL STATUS EXAMINATION:  Appearance:  A casually dressed, thin, Caucasian female.  Cooperative.  Poor eye contact.  Speech very low.  There is like a spontaneity, however, it is relevant when she does answer the questions. Mood:  Crying, very sad.  Affect is depressed.  She denies suicidal ideation.  She denies homicidal ideation.  Thought processes are logical and coherent without evidence of psychosis.  No hallucinations.  No signs or symptoms of alcohol withdrawal.  Cognitively she is alert and oriented.  Her cognitive of function appears to be intact.  CURRENT DIAGNOSES: Axis I:    Major depression, single episode and alcohol dependence. Axis II:   Deferred. Axis III:  Status post childbirth five months ago. Axis IV:   Severe related to problems with primary support group, social            environment and her alcohol abuse problem.  Also the            birth of her daughter five months ago. Axis V:     Current global assessment of functioning is 35, highest            in the past year 75.  CURRENT TREATMENT PLAN AND RECOMMENDATION:  Voluntary admission to [facility] Unit.  Our goal will be to maintain her safety.  Check q.15 minutes and have her contract for safety.  We will continue her Prozac 20 mg one p.o. q.a.m. for her depression and she may use her own supply of Micronor birth control pills.  Her family plans  to bring them tonight.  We will put her on a low-dose Librium protocol for now.  This might be reevaluated later.  She states she has never had DTs, seizures, or black outs.  Also we will force fluids i.e. Gatorade.  She is to attend the dual diagnose group.  We will do a thyroid profile on her.  All other lab work was done at [facility] ED.  Also schedule a family session with the patient and her husband regarding her history and what has been going on for the last five months.  TENTATIVE LENGTH AND DISCHARGE PLAN:  Three days. DD:  11/20/99 TD:  11/22/99 Job: [id number]

## 2010-08-04 NOTE — Consult Note (Signed)
[name], [name]              ACCOUNT NO.:  [id number]   MEDICAL RECORD NO.:  [id number]          PATIENT TYPE:  INP   LOCATION:  1610                         FACILITY:  [facility]   PHYSICIAN:  Petra Kuba, M.D.    DATE OF BIRTH:  1966/02/09   DATE OF CONSULTATION:  01/22/2004  DATE OF DISCHARGE:                                   CONSULTATION   HISTORY:  The patient is seen at the request of the [facility] doctors for  upper GI bleeding.  She has a moderate-to-long history of alcohol abuse and  has increased her consumption of late with about a 12 hour history of  vomiting some blood, started on Friday, went on to the early morning and  Saturday.  She has seen some dark stools but not any diarrhea.  Had some  vague abdominal pains, has not been on any aspirin, nonsteroidals, has not  had any previous GI problems or work-ups before. Her past medical history is  essentially negative.  She has not had any operations.  Her medicines are  birth control pills only.   ALLERGIES:  SULFA.   SOCIAL HISTORY:  Smokes and drinks but denies drug use and denies much over-  the-counter medicine use.   FAMILY HISTORY:  Negative for any obvious ulcers or GI problems.   REVIEW OF SYSTEMS:  Negative except above.   PHYSICAL EXAMINATION:  VITAL SIGNS:  See chart.  GENERAL:  No acute distress, lying comfortably in the bed.  ABDOMEN:  Soft, nontender.   LABORATORY DATA:  Pertinent for normal coags and platelets with a hemoglobin  of 11.2 which is decreased to 9 with hydration, MCV 101, BUN 29, creatinine  0.8.  Liver tests are fairly unremarkable, but her total bili of 2.5,  albumin 3.6.   ASSESSMENT:  1.  Upper gastrointestinal bleeding.  2.  Alcohol abuse.   PLAN:  The risks, benefits, methods of endoscopy were discussed.  We will  proceed p.r.n. signs of active bleeding or in the a.m.  I have discussed the  procedure with her, and she agrees.      MEM/MEDQ  D:  01/22/2004  T:   01/23/2004  Job:  [id number]   cc:   [facility] Team

## 2010-12-12 LAB — PROTIME-INR
INR: 1
Prothrombin Time: 12.9

## 2010-12-12 LAB — DIFFERENTIAL
Lymphocytes Relative: 28
Lymphs Abs: 1.9
Monocytes Relative: 4
Neutro Abs: 4.6
Neutrophils Relative %: 68

## 2010-12-12 LAB — URINE CULTURE: Colony Count: NO GROWTH

## 2010-12-12 LAB — URINALYSIS, ROUTINE W REFLEX MICROSCOPIC
Bilirubin Urine: NEGATIVE
Ketones, ur: NEGATIVE
Nitrite: NEGATIVE
Protein, ur: NEGATIVE

## 2010-12-12 LAB — LIPID PANEL
Cholesterol: 188
LDL Cholesterol: 80
VLDL: 10

## 2010-12-12 LAB — CK TOTAL AND CKMB (NOT AT ARMC)
CK, MB: 1.5
Total CK: 91

## 2010-12-12 LAB — COMPREHENSIVE METABOLIC PANEL
ALT: 33
Calcium: 8.4
Creatinine, Ser: 0.49
Glucose, Bld: 95
Sodium: 138
Total Protein: 6.7

## 2010-12-12 LAB — TSH: TSH: 1.527

## 2010-12-12 LAB — SALICYLATE LEVEL: Salicylate Lvl: <4

## 2010-12-12 LAB — ACETAMINOPHEN LEVEL: Acetaminophen (Tylenol), Serum: <10 — ABNORMAL LOW

## 2010-12-12 LAB — RAPID URINE DRUG SCREEN, HOSP PERFORMED
Barbiturates: NOT DETECTED
Opiates: NOT DETECTED

## 2010-12-12 LAB — APTT: aPTT: 28

## 2010-12-12 LAB — CBC
Hemoglobin: 14
MCHC: 33.7
MCV: 96.9
RDW: 13.2

## 2010-12-22 LAB — I-STAT 8, (EC8 V) (CONVERTED LAB)
Acid-base deficit: 1
BUN: <3 — ABNORMAL LOW
Bicarbonate: 23.3
HCT: 43
Hemoglobin: 14.6
Operator id: 294521
Sodium: 131 — ABNORMAL LOW
TCO2: 24

## 2010-12-22 LAB — CBC
MCV: 99.6
Platelets: 142 — ABNORMAL LOW
RDW: 11.3 — ABNORMAL LOW
WBC: 9

## 2010-12-22 LAB — RAPID URINE DRUG SCREEN, HOSP PERFORMED
Amphetamines: NOT DETECTED
Benzodiazepines: NOT DETECTED
Cocaine: NOT DETECTED
Tetrahydrocannabinol: NOT DETECTED

## 2010-12-22 LAB — DIFFERENTIAL
Basophils Absolute: 0
Basophils Relative: 0
Eosinophils Absolute: 0 — ABNORMAL LOW
Lymphs Abs: 2.1
Neutrophils Relative %: 74

## 2011-12-24 ENCOUNTER — Other Ambulatory Visit (HOSPITAL_COMMUNITY): Payer: Self-pay | Admitting: Obstetrics and Gynecology

## 2011-12-24 DIAGNOSIS — Z1231 Encounter for screening mammogram for malignant neoplasm of breast: Secondary | ICD-10-CM

## 2012-01-09 ENCOUNTER — Ambulatory Visit (HOSPITAL_COMMUNITY)
Admission: RE | Admit: 2012-01-09 | Discharge: 2012-01-09 | Disposition: A | Discharge status: Home / Self Care | Payer: Self-pay | Source: Ambulatory Visit | Attending: Obstetrics and Gynecology | Admitting: Obstetrics and Gynecology

## 2012-01-09 DIAGNOSIS — Z1231 Encounter for screening mammogram for malignant neoplasm of breast: Secondary | ICD-10-CM

## 2013-01-13 ENCOUNTER — Other Ambulatory Visit (HOSPITAL_COMMUNITY): Payer: Self-pay | Admitting: Obstetrics and Gynecology

## 2013-01-13 DIAGNOSIS — Z1231 Encounter for screening mammogram for malignant neoplasm of breast: Secondary | ICD-10-CM

## 2013-02-04 ENCOUNTER — Ambulatory Visit (HOSPITAL_COMMUNITY)
Admission: RE | Admit: 2013-02-04 | Discharge: 2013-02-04 | Disposition: A | Discharge status: Home / Self Care | Payer: Medicaid Other | Source: Ambulatory Visit | Attending: Obstetrics and Gynecology | Admitting: Obstetrics and Gynecology

## 2013-02-04 DIAGNOSIS — Z1231 Encounter for screening mammogram for malignant neoplasm of breast: Secondary | ICD-10-CM

## 2014-03-03 ENCOUNTER — Other Ambulatory Visit: Payer: Self-pay | Admitting: Obstetrics and Gynecology

## 2014-03-04 LAB — CYTOLOGY - PAP

## 2015-12-07 ENCOUNTER — Other Ambulatory Visit: Payer: Self-pay | Admitting: Obstetrics and Gynecology

## 2015-12-07 DIAGNOSIS — Z1231 Encounter for screening mammogram for malignant neoplasm of breast: Secondary | ICD-10-CM

## 2015-12-16 ENCOUNTER — Ambulatory Visit
Admission: RE | Admit: 2015-12-16 | Discharge: 2015-12-16 | Disposition: A | Discharge status: Home / Self Care | Payer: No Typology Code available for payment source | Source: Ambulatory Visit | Attending: Obstetrics and Gynecology | Admitting: Obstetrics and Gynecology

## 2015-12-16 DIAGNOSIS — Z1231 Encounter for screening mammogram for malignant neoplasm of breast: Secondary | ICD-10-CM

## 2016-01-26 ENCOUNTER — Other Ambulatory Visit: Payer: Self-pay | Admitting: Obstetrics and Gynecology

## 2016-01-26 DIAGNOSIS — Z1231 Encounter for screening mammogram for malignant neoplasm of breast: Secondary | ICD-10-CM

## 2016-01-27 ENCOUNTER — Ambulatory Visit (HOSPITAL_COMMUNITY)
Admission: RE | Admit: 2016-01-27 | Discharge: 2016-01-27 | Disposition: A | Discharge status: Home / Self Care | Payer: Self-pay | Source: Ambulatory Visit | Attending: Obstetrics and Gynecology | Admitting: Obstetrics and Gynecology

## 2016-01-27 ENCOUNTER — Ambulatory Visit
Admission: RE | Admit: 2016-01-27 | Discharge: 2016-01-27 | Disposition: A | Discharge status: Home / Self Care | Payer: No Typology Code available for payment source | Source: Ambulatory Visit | Attending: Obstetrics and Gynecology | Admitting: Obstetrics and Gynecology

## 2016-01-27 ENCOUNTER — Encounter (HOSPITAL_COMMUNITY): Payer: Self-pay

## 2016-01-27 VITALS — BP 126/78 | Temp 99.1°F | Ht 65.0 in | Wt 170.4 lb

## 2016-01-27 DIAGNOSIS — Z1231 Encounter for screening mammogram for malignant neoplasm of breast: Secondary | ICD-10-CM

## 2016-01-27 DIAGNOSIS — Z1239 Encounter for other screening for malignant neoplasm of breast: Secondary | ICD-10-CM

## 2016-01-27 NOTE — Progress Notes (Signed)
No complaints today.   Pap Smear: Pap smear not completed today. Last Pap smear was in December 2016 and normal per patient. Per patient has a history of an abnormal Pap smear 2-3 years that a repeat Pap smear was completed for follow up. Patient is going to have her results sent to [facility] to determine when next Pap smear is due. Last Pap smear result is not in EPIC. Previous Pap smear on 03/03/2014 at [facility] for [facility] that was normal is in EPIC.  Physical exam: Breasts Breasts symmetrical. No skin abnormalities bilateral breasts. No nipple retraction bilateral breasts. No nipple discharge bilateral breasts. No lymphadenopathy. No lumps palpated bilateral breasts. No complaints of pain or tenderness on exam. Referred patient to the [facility] for a screening mammogram. Appointment scheduled for Friday, January 27, 2016 at 1300.        Pelvic/Bimanual No Pap smear completed today since last Pap smear was in December 2016. Pap smear not indicated per [facility] guidelines.   Smoking History: Patient has never smoked.  Patient Navigation: Patient education provided. Access to services provided for patient through [facility] program.

## 2016-01-27 NOTE — Patient Instructions (Addendum)
Explained breast self awareness to [name]. Patient to get Pap smear results sent to [facility] to determine when next Pap smear is due. Let her know [facility] will cover Pap smears every 3 years unless has a history of abnormal Pap smears. Referred patient to the [facility] for a screening mammogram. Appointment scheduled for Friday, January 27, 2016 at 1300. Let [facility] know that [facility] with her within the next couple of weeks with results of mammogram by letter or phone. [name] verbalized understanding.  Chellsie Gomer, Arvil Chaco, RN 1:07 PM

## 2016-01-27 NOTE — Addendum Note (Signed)
Encounter addended by: Loletta Parish, RN on: 01/27/2016  1:11 PM<BR>    Actions taken: Sign clinical note

## 2016-01-30 ENCOUNTER — Encounter (HOSPITAL_COMMUNITY): Payer: Self-pay | Admitting: *Deleted

## 2017-05-21 ENCOUNTER — Ambulatory Visit: Payer: Self-pay | Admitting: Emergency Medicine

## 2017-05-21 VITALS — BP 124/92 | HR 83 | Temp 98.4°F | Resp 17

## 2017-05-21 DIAGNOSIS — B9789 Other viral agents as the cause of diseases classified elsewhere: Secondary | ICD-10-CM

## 2017-05-21 DIAGNOSIS — J069 Acute upper respiratory infection, unspecified: Secondary | ICD-10-CM

## 2017-05-21 MED ORDER — BENZONATATE 100 MG PO CAPS: 100.0000 mg | ORAL_CAPSULE | Freq: Three times a day (TID) | ORAL | 0 refills | Status: DC | PRN

## 2017-05-21 MED ORDER — FLUTICASONE PROPIONATE 50 MCG/ACT NA SUSP: 2.0000 | Freq: Two times a day (BID) | NASAL | 0 refills | Status: DC

## 2017-05-21 NOTE — Progress Notes (Signed)
Subjective:     [name] is a 52 y.o. female who presents for evaluation of symptoms of a URI. Symptoms include congestion, cough described as nonproductive, nasal congestion, non productive cough and sore throat. Onset of symptoms was 7 days ago, and has been unchanged since that time. Treatment to date: tylenol.  The following portions of the patient's history were reviewed and updated as appropriate: allergies and current medications.  Review of Systems Pertinent items noted in HPI and remainder of comprehensive ROS otherwise negative.   Objective:    BP (!) 124/92 (BP Location: Right Arm, Patient Position: Sitting, Cuff Size: Normal)   Pulse 83   Temp 98.4 F (36.9 C) (Oral)   Resp 17   SpO2 99%  General appearance: alert, cooperative and appears stated age Head: Normocephalic, without obvious abnormality, atraumatic Ears: normal TM's and external ear canals both ears Nose: Nares normal. Septum midline. Mucosa normal. No drainage or sinus tenderness. Throat: lips, mucosa, and tongue normal; teeth and gums normal Lungs: clear to auscultation bilaterally Heart: regular rate and rhythm Extremities: extremities normal, atraumatic, no cyanosis or edema Pulses: 2+ and symmetric   Assessment:    viral upper respiratory illness   Plan:    Discussed diagnosis and treatment of URI. Discussed the importance of avoiding unnecessary antibiotic therapy. Suggested symptomatic OTC remedies. Nasal saline spray for congestion. Nasal steroids per orders. Follow up as needed.

## 2017-05-21 NOTE — Patient Instructions (Signed)

## 2017-05-23 MED ORDER — AZITHROMYCIN 250 MG PO TABS: ORAL_TABLET | ORAL | 0 refills | Status: DC

## 2017-05-23 NOTE — Addendum Note (Signed)
Addended by: Luanne Bras on: 05/23/2017 03:06 PM   Modules accepted: Orders

## 2017-05-23 NOTE — Progress Notes (Signed)
Patient called and stated she does not feel any better and she wanted something called in to her pharmacy. Spoke to provider and he stated it would be ok for me to send in azithromycin 250mg  tablets for pt. After confirming  patients allergies and pharmacy the meds were sent in electronically

## 2017-05-24 ENCOUNTER — Telehealth: Payer: Self-pay

## 2017-08-09 ENCOUNTER — Encounter: Payer: Self-pay | Admitting: Nurse Practitioner

## 2017-08-09 ENCOUNTER — Ambulatory Visit: Payer: Self-pay | Admitting: Nurse Practitioner

## 2017-08-09 VITALS — BP 140/80 | HR 88 | Temp 98.1°F | Wt 167.0 lb

## 2017-08-09 DIAGNOSIS — J0140 Acute pansinusitis, unspecified: Secondary | ICD-10-CM

## 2017-08-09 MED ORDER — PSEUDOEPH-BROMPHEN-DM 30-2-10 MG/5ML PO SYRP: 5.0000 mL | ORAL_SOLUTION | Freq: Four times a day (QID) | ORAL | 0 refills | Status: AC | PRN

## 2017-08-09 MED ORDER — FLUTICASONE PROPIONATE 50 MCG/ACT NA SUSP: 2.0000 | Freq: Every day | NASAL | 0 refills | Status: DC

## 2017-08-09 MED ORDER — AMOXICILLIN-POT CLAVULANATE 875-125 MG PO TABS: 1.0000 | ORAL_TABLET | Freq: Two times a day (BID) | ORAL | 0 refills | Status: AC

## 2017-08-09 NOTE — Patient Instructions (Signed)

## 2017-08-09 NOTE — Progress Notes (Signed)
Subjective:  [name] is a 52 y.o. female who presents for evaluation of possible sinusitis.  Symptoms include bilateral ear pressure/pain.  Onset of symptoms was 7 days ago, and has been gradually worsening since that time.  Treatment to date:  antihistamines, cough suppressants, decongestants and ibuprofen.  High risk factors for influenza complications:  none.  The following portions of the patient's history were reviewed and updated as appropriate:  allergies, current medications and past medical history.  Constitutional: positive for anorexia, fatigue and fevers, negative for malaise and sweats Eyes: negative Ears, nose, mouth, throat, and face: positive for nasal congestion, sore throat and bilateral ear pressure/fullness, negative for ear drainage, earaches and hoarseness Respiratory: positive for cough and sputum, negative for asthma, chronic bronchitis, dyspnea on exertion, pneumonia, stridor and wheezing Cardiovascular: negative Gastrointestinal: positive for decreased appetite, negative for abdominal pain, diarrhea, nausea and vomiting Neurological: positive for headaches, negative for coordination problems, dizziness, paresthesia, tremors, vertigo and weakness Allergic/Immunologic: positive for hay fever Objective:  There were no vitals taken for this visit. General appearance: alert, cooperative, fatigued and no distress Head: Normocephalic, without obvious abnormality, atraumatic Eyes: conjunctivae/corneas clear. PERRL, EOM's intact. Fundi benign. Ears: abnormal TM right ear - mucoid middle ear fluid and abnormal TM left ear - mucoid middle ear fluid Nose: no discharge, mild congestion, turbinates swollen, inflamed, moderate maxillary sinus tenderness bilateral, moderate frontal sinus tenderness left Throat: abnormal findings: mild oropharyngeal erythema Lungs: clear to auscultation bilaterally Heart: regular rate and rhythm, S1, S2 normal, no murmur, click, rub or  gallop Abdomen: soft, non-tender; bowel sounds normal; no masses,  no organomegaly Pulses: 2+ and symmetric Skin: Skin color, texture, turgor normal. No rashes or lesions Lymph nodes: mild cervical adenopathy Neurologic: Grossly normal    Assessment:  Acute Pansinusitis    Plan:  Discussed diagnosis and treatment of sinusitis. Educational material distributed and questions answered. Suggested symptomatic OTC remedies. Supportive care with appropriate antipyretics and fluids. Nasal saline spray for congestion. Augmentin, Bromfed and Fluticasone per orders. Nasal steroids per orders. Follow up as needed.  Patient instructed to use humidifier at home.  Patient also instructed to use warm salt water gargles, honey with or without limiting, and throat lozenges for throat discomfort.  Patient instructed to use ibuprofen 800 mg every 8 hours for 3 days.  Also discussed with patient to start using Zyrtec 10 mg daily to help with seasonal allergies.  Patient verbalizes understanding and has no questions at time of discharge. Meds ordered this encounter  Medications  . amoxicillin-clavulanate (AUGMENTIN) 875-125 MG tablet    Sig: Take 1 tablet by mouth 2 (two) times daily for 10 days.    Dispense:  20 tablet    Refill:  0    Order Specific Question:   Supervising Provider    Answer:   Ricard Dillon [[id number]]  . fluticasone (FLONASE) 50 MCG/ACT nasal spray    Sig: Place 2 sprays into both nostrils daily for 10 days.    Dispense:  16 g    Refill:  0    Order Specific Question:   Supervising Provider    Answer:   Ricard Dillon [[id number]]  . brompheniramine-pseudoephedrine-DM 30-2-10 MG/5ML syrup    Sig: Take 5 mLs by mouth 4 (four) times daily as needed for up to 7 days.    Dispense:  150 mL    Refill:  0    Order Specific Question:   Supervising Provider    Answer:   Benay Pillow  E [[id number]]

## 2019-04-28 NOTE — Telephone Encounter (Signed)
Error

## 2019-08-20 ENCOUNTER — Other Ambulatory Visit: Payer: Self-pay

## 2019-08-20 ENCOUNTER — Encounter: Payer: Self-pay | Admitting: Internal Medicine

## 2019-08-20 ENCOUNTER — Ambulatory Visit (INDEPENDENT_AMBULATORY_CARE_PROVIDER_SITE_OTHER): Payer: 59 | Admitting: Internal Medicine

## 2019-08-20 VITALS — BP 140/90 | HR 78 | Temp 97.4°F | Ht 65.0 in | Wt 160.5 lb

## 2019-08-20 DIAGNOSIS — Z1211 Encounter for screening for malignant neoplasm of colon: Secondary | ICD-10-CM | POA: Diagnosis not present

## 2019-08-20 DIAGNOSIS — Z124 Encounter for screening for malignant neoplasm of cervix: Secondary | ICD-10-CM

## 2019-08-20 DIAGNOSIS — Z1231 Encounter for screening mammogram for malignant neoplasm of breast: Secondary | ICD-10-CM

## 2019-08-20 DIAGNOSIS — Z23 Encounter for immunization: Secondary | ICD-10-CM | POA: Diagnosis not present

## 2019-08-20 DIAGNOSIS — Z0000 Encounter for general adult medical examination without abnormal findings: Secondary | ICD-10-CM | POA: Diagnosis not present

## 2019-08-20 DIAGNOSIS — R030 Elevated blood-pressure reading, without diagnosis of hypertension: Secondary | ICD-10-CM

## 2019-08-20 NOTE — Patient Instructions (Signed)
-Nice seeing you today!!  -Return fasting for lab work.  -Tetanus and shingles vaccines today.  -Referrals for mammogram, colonoscopy and pap smear have been placed today.  -Check blood pressure at home 2-3 times a week and bring in those measurements to your next visit.  -Schedule follow up in 6 months.   Preventive Care 53-53 Years Old, Female Preventive care refers to visits with your health care provider and lifestyle choices that can promote health and wellness. This includes:  A yearly physical exam. This may also be called an annual well check.  Regular dental visits and eye exams.  Immunizations.  Screening for certain conditions.  Healthy lifestyle choices, such as eating a healthy diet, getting regular exercise, not using drugs or products that contain nicotine and tobacco, and limiting alcohol use. What can I expect for my preventive care visit? Physical exam Your health care provider will check your:  Height and weight. This may be used to calculate body mass index (BMI), which tells if you are at a healthy weight.  Heart rate and blood pressure.  Skin for abnormal spots. Counseling Your health care provider may ask you questions about your:  Alcohol, tobacco, and drug use.  Emotional well-being.  Home and relationship well-being.  Sexual activity.  Eating habits.  Work and work Statistician.  Method of birth control.  Menstrual cycle.  Pregnancy history. What immunizations do I need?  Influenza (flu) vaccine  This is recommended every year. Tetanus, diphtheria, and pertussis (Tdap) vaccine  You may need a Td booster every 10 years. Varicella (chickenpox) vaccine  You may need this if you have not been vaccinated. Zoster (shingles) vaccine  You may need this after age 21. Measles, mumps, and rubella (MMR) vaccine  You may need at least one dose of MMR if you were born in 1957 or later. You may also need a second dose. Pneumococcal  conjugate (PCV13) vaccine  You may need this if you have certain conditions and were not previously vaccinated. Pneumococcal polysaccharide (PPSV23) vaccine  You may need one or two doses if you smoke cigarettes or if you have certain conditions. Meningococcal conjugate (MenACWY) vaccine  You may need this if you have certain conditions. Hepatitis A vaccine  You may need this if you have certain conditions or if you travel or work in places where you may be exposed to hepatitis A. Hepatitis B vaccine  You may need this if you have certain conditions or if you travel or work in places where you may be exposed to hepatitis B. Haemophilus influenzae type b (Hib) vaccine  You may need this if you have certain conditions. Human papillomavirus (HPV) vaccine  If recommended by your health care provider, you may need three doses over 6 months. You may receive vaccines as individual doses or as more than one vaccine together in one shot (combination vaccines). Talk with your health care provider about the risks and benefits of combination vaccines. What tests do I need? Blood tests  Lipid and cholesterol levels. These may be checked every 5 years, or more frequently if you are over 23 years old.  Hepatitis C test.  Hepatitis B test. Screening  Lung cancer screening. You may have this screening every year starting at age 64 if you have a 30-pack-year history of smoking and currently smoke or have quit within the past 15 years.  Colorectal cancer screening. All adults should have this screening starting at age 60 and continuing until age 54. Your health  care provider may recommend screening at age 20 if you are at increased risk. You will have tests every 1-10 years, depending on your results and the type of screening test.  Diabetes screening. This is done by checking your blood sugar (glucose) after you have not eaten for a while (fasting). You may have this done every 1-3  years.  Mammogram. This may be done every 1-2 years. Talk with your health care provider about when you should start having regular mammograms. This may depend on whether you have a family history of breast cancer.  BRCA-related cancer screening. This may be done if you have a family history of breast, ovarian, tubal, or peritoneal cancers.  Pelvic exam and Pap test. This may be done every 3 years starting at age 64. Starting at age 76, this may be done every 5 years if you have a Pap test in combination with an HPV test. Other tests  Sexually transmitted disease (STD) testing.  Bone density scan. This is done to screen for osteoporosis. You may have this scan if you are at high risk for osteoporosis. Follow these instructions at home: Eating and drinking  Eat a diet that includes fresh fruits and vegetables, whole grains, lean protein, and low-fat dairy.  Take vitamin and mineral supplements as recommended by your health care provider.  Do not drink alcohol if: ? Your health care provider tells you not to drink. ? You are pregnant, may be pregnant, or are planning to become pregnant.  If you drink alcohol: ? Limit how much you have to 0-1 drink a day. ? Be aware of how much alcohol is in your drink. In the [place] one drink equals one 12 oz bottle of beer (355 mL), one 5 oz glass of wine (148 mL), or one 1 oz glass of hard liquor (44 mL). Lifestyle  Take daily care of your teeth and gums.  Stay active. Exercise for at least 30 minutes on 5 or more days each week.  Do not use any products that contain nicotine or tobacco, such as cigarettes, e-cigarettes, and chewing tobacco. If you need help quitting, ask your health care provider.  If you are sexually active, practice safe sex. Use a condom or other form of birth control (contraception) in order to prevent pregnancy and STIs (sexually transmitted infections).  If told by your health care provider, take low-dose aspirin daily  starting at age 1. What's next?  Visit your health care provider once a year for a well check visit.  Ask your health care provider how often you should have your eyes and teeth checked.  Stay up to date on all vaccines. This information is not intended to replace advice given to you by your health care provider. Make sure you discuss any questions you have with your health care provider. Document Revised: 11/14/2017 Document Reviewed: 11/14/2017 Elsevier Patient Education  2020 [employer].

## 2019-08-20 NOTE — Progress Notes (Signed)
New Patient Office Visit     This visit occurred during the SARS-CoV-2 public health emergency.  Safety protocols were in place, including screening questions prior to the visit, additional usage of staff PPE, and extensive cleaning of exam room while observing appropriate contact time as indicated for disinfecting solutions.    CC/Reason for Visit: Establish care, annual preventive exam Previous PCP: None Last Visit: Unknown  HPI: [name] is a 54 y.o. female who is coming in today for the above mentioned reasons.  She has not had medical care since she was a child.  She works as an Electrical engineer, she is married, she has 4 children, she has allergies to sulfa drugs which cause hives, she has no past surgical history, she was a smoker of about 1/2 pack a day but quit about 20 years ago, she drinks about 2 beers/wine a day.  She has routine dental care but no eye care.  She has a history of depression and anxiety and follows with psychiatry is currently prescribed Pristiq, Seroquel and as needed Valium.  She has no complaints today.  She needs updated cancer screening, she is also due for Tdap booster, shingles and Covid vaccinations.   Past Medical/Surgical History: History reviewed. No pertinent past medical history.  History reviewed. No pertinent surgical history.  Social History:  reports that she has quit smoking. She has never used smokeless tobacco. She reports current alcohol use of about 14.0 standard drinks of alcohol per week. She reports that she does not use drugs.  Allergies: Allergies  Allergen Reactions  . Sulfa Antibiotics     Family History:  Family History  Problem Relation Age of Onset  . Hypertension Mother   . Stomach cancer Maternal Grandmother   . CAD Maternal Grandfather   . Heart attack Maternal Grandfather   . Breast cancer Maternal Aunt      Current Outpatient Medications:  .  levonorgestrel (MIRENA) 20 MCG/24HR IUD, by  Intrauterine route., Disp: , Rfl:  .  PRISTIQ 50 MG 24 hr tablet, Take 50 mg by mouth every morning., Disp: , Rfl:  .  SEROQUEL 25 MG tablet, Take 25 mg by mouth at bedtime., Disp: , Rfl:   Review of Systems:  Constitutional: Denies fever, chills, diaphoresis, appetite change and fatigue.  HEENT: Denies photophobia, eye pain, redness, hearing loss, ear pain, congestion, sore throat, rhinorrhea, sneezing, mouth sores, trouble swallowing, neck pain, neck stiffness and tinnitus.   Respiratory: Denies SOB, DOE, cough, chest tightness,  and wheezing.   Cardiovascular: Denies chest pain, palpitations and leg swelling.  Gastrointestinal: Denies nausea, vomiting, abdominal pain, diarrhea, constipation, blood in stool and abdominal distention.  Genitourinary: Denies dysuria, urgency, frequency, hematuria, flank pain and difficulty urinating.  Endocrine: Denies: hot or cold intolerance, sweats, changes in hair or nails, polyuria, polydipsia. Musculoskeletal: Denies myalgias, back pain, joint swelling, arthralgias and gait problem.  Skin: Denies pallor, rash and wound.  Neurological: Denies dizziness, seizures, syncope, weakness, light-headedness, numbness and headaches.  Hematological: Denies adenopathy. Easy bruising, personal or family bleeding history  Psychiatric/Behavioral: Denies suicidal ideation, mood changes, confusion, nervousness, sleep disturbance and agitation    Physical Exam: Vitals:   08/20/19 1517  BP: 140/90  Pulse: 78  Temp: (!) 97.4 F (36.3 C)  TempSrc: Temporal  SpO2: 99%  Weight: 160 lb 8 oz (72.8 kg)  Height: 5' 5"  (1.651 m)   Body mass index is 26.71 kg/m.  Constitutional: NAD, calm, comfortable Eyes: PERRL, lids and  conjunctivae normal ENMT: Mucous membranes are moist. Tympanic membrane is pearly white, no erythema or bulging. Neck: normal, supple, no masses, no thyromegaly Respiratory: clear to auscultation bilaterally, no wheezing, no crackles. Normal  respiratory effort. No accessory muscle use.  Cardiovascular: Regular rate and rhythm, no murmurs / rubs / gallops. No extremity edema. 2+ pedal pulses. No carotid bruits.  Abdomen: no tenderness, no masses palpated. No hepatosplenomegaly. Bowel sounds positive.  Musculoskeletal: no clubbing / cyanosis. No joint deformity upper and lower extremities. Good ROM, no contractures. Normal muscle tone.  Skin: no rashes, lesions, ulcers. No induration Neurologic: CN 2-12 grossly intact. Sensation intact, DTR normal. Strength 5/5 in all 4.  Psychiatric: Normal judgment and insight. Alert and oriented x 3. Normal mood.    Impression and Plan:  Encounter for preventive health examination  -Have advised routine eye and dental care. -She will get Tdap and her first shingles vaccine today.  She is also due for Covid vaccines but she refuses at this time. -She will return fasting for labs. -Healthy lifestyle discussed in detail. -Sent for mammogram, colonoscopy and GYN as she is overdue for all.  Screening for malignant neoplasm of colon  - Plan: Ambulatory referral to Gastroenterology  Encounter for screening mammogram for malignant neoplasm of breast  - Plan: MM Digital Screening  Screening for cervical cancer  - Plan: Ambulatory referral to Obstetrics / Gynecology; she also needs her Mirena updated as it has been in for 6 years.  Elevated BP without diagnosis of hypertension -Blood pressure noted to be 140/90 today. -She will do ambulatory blood pressure monitoring and return in 3 months for follow-up.  Need for diphtheria-tetanus-pertussis (Tdap) vaccine  - Plan: Tdap vaccine greater than or equal to 7yo IM  Need for shingles vaccine  - Plan: Varicella-zoster vaccine IM (Shingrix)    Patient Instructions  -Nice seeing you today!!  -Return fasting for lab work.  -Tetanus and shingles vaccines today.  -Referrals for mammogram, colonoscopy and pap smear have been placed  today.  -Check blood pressure at home 2-3 times a week and bring in those measurements to your next visit.  -Schedule follow up in 6 months.   Preventive Care 72-10 Years Old, Female Preventive care refers to visits with your health care provider and lifestyle choices that can promote health and wellness. This includes:  A yearly physical exam. This may also be called an annual well check.  Regular dental visits and eye exams.  Immunizations.  Screening for certain conditions.  Healthy lifestyle choices, such as eating a healthy diet, getting regular exercise, not using drugs or products that contain nicotine and tobacco, and limiting alcohol use. What can I expect for my preventive care visit? Physical exam Your health care provider will check your:  Height and weight. This may be used to calculate body mass index (BMI), which tells if you are at a healthy weight.  Heart rate and blood pressure.  Skin for abnormal spots. Counseling Your health care provider may ask you questions about your:  Alcohol, tobacco, and drug use.  Emotional well-being.  Home and relationship well-being.  Sexual activity.  Eating habits.  Work and work Statistician.  Method of birth control.  Menstrual cycle.  Pregnancy history. What immunizations do I need?  Influenza (flu) vaccine  This is recommended every year. Tetanus, diphtheria, and pertussis (Tdap) vaccine  You may need a Td booster every 10 years. Varicella (chickenpox) vaccine  You may need this if you have not been vaccinated.  Zoster (shingles) vaccine  You may need this after age 20. Measles, mumps, and rubella (MMR) vaccine  You may need at least one dose of MMR if you were born in 1957 or later. You may also need a second dose. Pneumococcal conjugate (PCV13) vaccine  You may need this if you have certain conditions and were not previously vaccinated. Pneumococcal polysaccharide (PPSV23) vaccine  You may need  one or two doses if you smoke cigarettes or if you have certain conditions. Meningococcal conjugate (MenACWY) vaccine  You may need this if you have certain conditions. Hepatitis A vaccine  You may need this if you have certain conditions or if you travel or work in places where you may be exposed to hepatitis A. Hepatitis B vaccine  You may need this if you have certain conditions or if you travel or work in places where you may be exposed to hepatitis B. Haemophilus influenzae type b (Hib) vaccine  You may need this if you have certain conditions. Human papillomavirus (HPV) vaccine  If recommended by your health care provider, you may need three doses over 6 months. You may receive vaccines as individual doses or as more than one vaccine together in one shot (combination vaccines). Talk with your health care provider about the risks and benefits of combination vaccines. What tests do I need? Blood tests  Lipid and cholesterol levels. These may be checked every 5 years, or more frequently if you are over 68 years old.  Hepatitis C test.  Hepatitis B test. Screening  Lung cancer screening. You may have this screening every year starting at age 101 if you have a 30-pack-year history of smoking and currently smoke or have quit within the past 15 years.  Colorectal cancer screening. All adults should have this screening starting at age 53 and continuing until age 86. Your health care provider may recommend screening at age 72 if you are at increased risk. You will have tests every 1-10 years, depending on your results and the type of screening test.  Diabetes screening. This is done by checking your blood sugar (glucose) after you have not eaten for a while (fasting). You may have this done every 1-3 years.  Mammogram. This may be done every 1-2 years. Talk with your health care provider about when you should start having regular mammograms. This may depend on whether you have a family  history of breast cancer.  BRCA-related cancer screening. This may be done if you have a family history of breast, ovarian, tubal, or peritoneal cancers.  Pelvic exam and Pap test. This may be done every 3 years starting at age 77. Starting at age 44, this may be done every 5 years if you have a Pap test in combination with an HPV test. Other tests  Sexually transmitted disease (STD) testing.  Bone density scan. This is done to screen for osteoporosis. You may have this scan if you are at high risk for osteoporosis. Follow these instructions at home: Eating and drinking  Eat a diet that includes fresh fruits and vegetables, whole grains, lean protein, and low-fat dairy.  Take vitamin and mineral supplements as recommended by your health care provider.  Do not drink alcohol if: ? Your health care provider tells you not to drink. ? You are pregnant, may be pregnant, or are planning to become pregnant.  If you drink alcohol: ? Limit how much you have to 0-1 drink a day. ? Be aware of how much alcohol is in  your drink. In the [place] one drink equals one 12 oz bottle of beer (355 mL), one 5 oz glass of wine (148 mL), or one 1 oz glass of hard liquor (44 mL). Lifestyle  Take daily care of your teeth and gums.  Stay active. Exercise for at least 30 minutes on 5 or more days each week.  Do not use any products that contain nicotine or tobacco, such as cigarettes, e-cigarettes, and chewing tobacco. If you need help quitting, ask your health care provider.  If you are sexually active, practice safe sex. Use a condom or other form of birth control (contraception) in order to prevent pregnancy and STIs (sexually transmitted infections).  If told by your health care provider, take low-dose aspirin daily starting at age 35. What's next?  Visit your health care provider once a year for a well check visit.  Ask your health care provider how often you should have your eyes and teeth  checked.  Stay up to date on all vaccines. This information is not intended to replace advice given to you by your health care provider. Make sure you discuss any questions you have with your health care provider. Document Revised: 11/14/2017 Document Reviewed: 11/14/2017 Elsevier Patient Education  2020 [place], [place] [facility]

## 2019-09-11 ENCOUNTER — Ambulatory Visit
Admission: RE | Admit: 2019-09-11 | Discharge: 2019-09-11 | Disposition: A | Discharge status: Home / Self Care | Payer: 59 | Source: Ambulatory Visit | Attending: Internal Medicine | Admitting: Internal Medicine

## 2019-09-11 ENCOUNTER — Other Ambulatory Visit: Payer: Self-pay

## 2019-09-11 DIAGNOSIS — Z1231 Encounter for screening mammogram for malignant neoplasm of breast: Secondary | ICD-10-CM

## 2019-10-05 ENCOUNTER — Ambulatory Visit (INDEPENDENT_AMBULATORY_CARE_PROVIDER_SITE_OTHER): Payer: 59 | Admitting: Nurse Practitioner

## 2019-10-05 ENCOUNTER — Encounter: Payer: Self-pay | Admitting: Nurse Practitioner

## 2019-10-05 ENCOUNTER — Other Ambulatory Visit: Payer: Self-pay

## 2019-10-05 VITALS — BP 118/76 | Ht 65.0 in | Wt 163.0 lb

## 2019-10-05 DIAGNOSIS — Z30432 Encounter for removal of intrauterine contraceptive device: Secondary | ICD-10-CM | POA: Diagnosis not present

## 2019-10-05 DIAGNOSIS — Z01419 Encounter for gynecological examination (general) (routine) without abnormal findings: Secondary | ICD-10-CM | POA: Diagnosis not present

## 2019-10-05 DIAGNOSIS — Z1151 Encounter for screening for human papillomavirus (HPV): Secondary | ICD-10-CM

## 2019-10-05 DIAGNOSIS — N951 Menopausal and female climacteric states: Secondary | ICD-10-CM

## 2019-10-05 LAB — FOLLICLE STIMULATING HORMONE: FSH: 31.1 m[IU]/mL

## 2019-10-05 NOTE — Patient Instructions (Signed)
Health Maintenance, Female Adopting a healthy lifestyle and getting preventive care are important in promoting health and wellness. Ask your health care provider about:  The right schedule for you to have regular tests and exams.  Things you can do on your own to prevent diseases and keep yourself healthy. What should I know about diet, weight, and exercise? Eat a healthy diet   Eat a diet that includes plenty of vegetables, fruits, low-fat dairy products, and lean protein.  Do not eat a lot of foods that are high in solid fats, added sugars, or sodium. Maintain a healthy weight Body mass index (BMI) is used to identify weight problems. It estimates body fat based on height and weight. Your health care provider can help determine your BMI and help you achieve or maintain a healthy weight. Get regular exercise Get regular exercise. This is one of the most important things you can do for your health. Most adults should:  Exercise for at least 150 minutes each week. The exercise should increase your heart rate and make you sweat (moderate-intensity exercise).  Do strengthening exercises at least twice a week. This is in addition to the moderate-intensity exercise.  Spend less time sitting. Even light physical activity can be beneficial. Watch cholesterol and blood lipids Have your blood tested for lipids and cholesterol at 53 years of age, then have this test every 5 years. Have your cholesterol levels checked more often if:  Your lipid or cholesterol levels are high.  You are older than 53 years of age.  You are at high risk for heart disease. What should I know about cancer screening? Depending on your health history and family history, you may need to have cancer screening at various ages. This may include screening for:  Breast cancer.  Cervical cancer.  Colorectal cancer.  Skin cancer.  Lung cancer. What should I know about heart disease, diabetes, and high blood  pressure? Blood pressure and heart disease  High blood pressure causes heart disease and increases the risk of stroke. This is more likely to develop in people who have high blood pressure readings, are of African descent, or are overweight.  Have your blood pressure checked: ? Every 3-5 years if you are 18-39 years of age. ? Every year if you are 40 years old or older. Diabetes Have regular diabetes screenings. This checks your fasting blood sugar level. Have the screening done:  Once every three years after age 40 if you are at a normal weight and have a low risk for diabetes.  More often and at a younger age if you are overweight or have a high risk for diabetes. What should I know about preventing infection? Hepatitis B If you have a higher risk for hepatitis B, you should be screened for this virus. Talk with your health care provider to find out if you are at risk for hepatitis B infection. Hepatitis C Testing is recommended for:  Everyone born from 1945 through 1965.  Anyone with known risk factors for hepatitis C. Sexually transmitted infections (STIs)  Get screened for STIs, including gonorrhea and chlamydia, if: ? You are sexually active and are younger than 53 years of age. ? You are older than 53 years of age and your health care provider tells you that you are at risk for this type of infection. ? Your sexual activity has changed since you were last screened, and you are at increased risk for chlamydia or gonorrhea. Ask your health care provider if   you are at risk.  Ask your health care provider about whether you are at high risk for HIV. Your health care provider may recommend a prescription medicine to help prevent HIV infection. If you choose to take medicine to prevent HIV, you should first get tested for HIV. You should then be tested every 3 months for as long as you are taking the medicine. Pregnancy  If you are about to stop having your period (premenopausal) and  you may become pregnant, seek counseling before you get pregnant.  Take 400 to 800 micrograms (mcg) of folic acid every day if you become pregnant.  Ask for birth control (contraception) if you want to prevent pregnancy. Osteoporosis and menopause Osteoporosis is a disease in which the bones lose minerals and strength with aging. This can result in bone fractures. If you are 65 years old or older, or if you are at risk for osteoporosis and fractures, ask your health care provider if you should:  Be screened for bone loss.  Take a calcium or vitamin D supplement to lower your risk of fractures.  Be given hormone replacement therapy (HRT) to treat symptoms of menopause. Follow these instructions at home: Lifestyle  Do not use any products that contain nicotine or tobacco, such as cigarettes, e-cigarettes, and chewing tobacco. If you need help quitting, ask your health care provider.  Do not use street drugs.  Do not share needles.  Ask your health care provider for help if you need support or information about quitting drugs. Alcohol use  Do not drink alcohol if: ? Your health care provider tells you not to drink. ? You are pregnant, may be pregnant, or are planning to become pregnant.  If you drink alcohol: ? Limit how much you use to 0-1 drink a day. ? Limit intake if you are breastfeeding.  Be aware of how much alcohol is in your drink. In the [place] one drink equals one 12 oz bottle of beer (355 mL), one 5 oz glass of wine (148 mL), or one 1 oz glass of hard liquor (44 mL). General instructions  Schedule regular health, dental, and eye exams.  Stay current with your vaccines.  Tell your health care provider if: ? You often feel depressed. ? You have ever been abused or do not feel safe at home. Summary  Adopting a healthy lifestyle and getting preventive care are important in promoting health and wellness.  Follow your health care provider's instructions about healthy  diet, exercising, and getting tested or screened for diseases.  Follow your health care provider's instructions on monitoring your cholesterol and blood pressure. This information is not intended to replace advice given to you by your health care provider. Make sure you discuss any questions you have with your health care provider. Document Revised: 02/26/2018 Document Reviewed: 02/26/2018 Elsevier Patient Education  2020 Elsevier Inc.  

## 2019-10-05 NOTE — Progress Notes (Addendum)
   [name] 09-03-1965 [id number]   History:  54 y.o. G4P4 presents as new patient to establish care without GYN complaints. Amenorrheic/ Mirena IUD placed 6 years ago. Does complain of occasional hot flashes and insomnia. History of ASCUS many years ago per patient. Recently established with PCP [name] who referred her to [place] for IUD management and get her up to date on preventative care. No medical care since childhood. Sees psychiatry for depression and anxiety.   Gynecologic History No LMP recorded. (Menstrual status: IUD).   Contraception: IUD Last Pap:2015 Last mammogram: 09/11/2019. Results were: normal Last colonoscopy: never  Past medical history, past surgical history, family history and social history were all reviewed and documented in the EPIC chart.  ROS:  A ROS was performed and pertinent positives and negatives are included.  Exam:  Vitals:   10/05/19 1539  Weight: 163 lb (73.9 kg)  Height: 5\' 5"  (1.651 m)   Body mass index is 27.12 kg/m.  General appearance:  Normal Thyroid:  Symmetrical, normal in size, without palpable masses or nodularity. Respiratory  Auscultation:  Clear without wheezing or rhonchi Cardiovascular  Auscultation:  Regular rate, without rubs, murmurs or gallops  Edema/varicosities:  Not grossly evident Abdominal  Soft,nontender, without masses, guarding or rebound.  Liver/spleen:  No organomegaly noted  Hernia:  None appreciated  Skin  Inspection:  Grossly normal   Breasts: Examined lying and sitting.   Right: Without masses, retractions, discharge or axillary adenopathy.   Left: Without masses, retractions, discharge or axillary adenopathy. Gentitourinary   Inguinal/mons:  Normal without inguinal adenopathy  External genitalia:  Normal  BUS/Urethra/Skene's glands:  Normal  Vagina:  Normal  Cervix:  Normal, IUD string visible in os  Uterus:  Anteverted, normal in size, shape and contour.  Midline and  mobile  Adnexa/parametria:     Rt: Without masses or tenderness.   Lt: Without masses or tenderness.  Anus and perineum: Normal   Assessment/Plan:  54 y.o. G4P4 as new patient.   Well female exam with routine gynecological exam - Education provided on SBEs, importance of preventative screenings, current guidelines, high calcium diet, regular exercise, and multivitamin daily. Pap with HPV today. Labs ordered by PCP, patient has not done them yet.   Encounter for IUD removal - IUD string visible in cervical os, removed, shown to patient, and discarded. Ibuprofen as needed for cramping, may have some spotting.   Menopausal symptoms - Plan: Follicle stimulating hormone to determine if patient is peri or postmenopausal to determine if contraception is needed.  Follow up in 1 year for annual       [place], 3:43 PM 10/05/2019

## 2019-10-05 NOTE — Addendum Note (Signed)
Addended by: Nelva Nay on: 10/05/2019 04:22 PM   Modules accepted: Orders

## 2019-10-06 LAB — PAP, TP IMAGING W/ HPV RNA, RFLX HPV TYPE 16,18/45: HPV DNA High Risk: NOT DETECTED

## 2019-10-20 ENCOUNTER — Encounter: Payer: Self-pay | Admitting: Gastroenterology

## 2019-11-04 ENCOUNTER — Other Ambulatory Visit: Payer: Self-pay

## 2019-11-04 ENCOUNTER — Encounter: Payer: Self-pay | Admitting: Internal Medicine

## 2019-11-04 ENCOUNTER — Telehealth: Payer: Self-pay | Admitting: Internal Medicine

## 2019-11-04 ENCOUNTER — Ambulatory Visit (INDEPENDENT_AMBULATORY_CARE_PROVIDER_SITE_OTHER): Payer: 59 | Admitting: Internal Medicine

## 2019-11-04 VITALS — BP 140/90 | HR 79 | Temp 98.3°F | Wt 161.3 lb

## 2019-11-04 DIAGNOSIS — N6310 Unspecified lump in the right breast, unspecified quadrant: Secondary | ICD-10-CM

## 2019-11-04 DIAGNOSIS — R030 Elevated blood-pressure reading, without diagnosis of hypertension: Secondary | ICD-10-CM

## 2019-11-04 NOTE — Patient Instructions (Signed)
-  Nice seeing you today!!  -We will request diagnostic imaging of your right breast to evaluate the lump.

## 2019-11-04 NOTE — Telephone Encounter (Signed)
Pt had her appt with Jerilee Hoh today but forgot to give her a form from her insurance to fill out. She would like it mailed to her upon completion at [address] [address]   Placed in red folder

## 2019-11-04 NOTE — Progress Notes (Signed)
Established Patient Office Visit     This visit occurred during the SARS-CoV-2 public health emergency.  Safety protocols were in place, including screening questions prior to the visit, additional usage of staff PPE, and extensive cleaning of exam room while observing appropriate contact time as indicated for disinfecting solutions.    CC/Reason for Visit: Evaluate right breast lump  HPI: [name] is a 54 y.o. female who is coming in today for the above mentioned reasons.  She had a negative screening mammogram in June.  During her GYN appointment in July she was noticed to have a right breast lump around the 9-10 o'clock area, however was not paid much attention to given her negative mammogram.  She comes in today as she feels this lump has increased in size and just wants to make sure.  She has also noticed some itching around her nipple area, no discharge, she has not had any recent vaccines.   Past Medical/Surgical History: No past medical history on file.  No past surgical history on file.  Social History:  reports that she has quit smoking. She has never used smokeless tobacco. She reports current alcohol use of about 14.0 standard drinks of alcohol per week. She reports that she does not use drugs.  Allergies: Allergies  Allergen Reactions  . Sulfa Antibiotics Hives    Family History:  Family History  Problem Relation Age of Onset  . Hypertension Mother   . Stomach cancer Maternal Grandmother   . CAD Maternal Grandfather   . Heart attack Maternal Grandfather   . Breast cancer Maternal Aunt 80  . Cancer Father        Skin     Current Outpatient Medications:  .  levonorgestrel (MIRENA) 20 MCG/24HR IUD, by Intrauterine route., Disp: , Rfl:  .  PRISTIQ 50 MG 24 hr tablet, Take 50 mg by mouth every morning., Disp: , Rfl:  .  SEROQUEL 25 MG tablet, Take 25 mg by mouth at bedtime., Disp: , Rfl:   Review of Systems:  Constitutional: Denies fever,  chills, diaphoresis, appetite change and fatigue.  HEENT: Denies photophobia, eye pain, redness, hearing loss, ear pain, congestion, sore throat, rhinorrhea, sneezing, mouth sores, trouble swallowing, neck pain, neck stiffness and tinnitus.   Respiratory: Denies SOB, DOE, cough, chest tightness,  and wheezing.   Cardiovascular: Denies chest pain, palpitations and leg swelling.  Gastrointestinal: Denies nausea, vomiting, abdominal pain, diarrhea, constipation, blood in stool and abdominal distention.  Genitourinary: Denies dysuria, urgency, frequency, hematuria, flank pain and difficulty urinating.  Endocrine: Denies: hot or cold intolerance, sweats, changes in hair or nails, polyuria, polydipsia. Musculoskeletal: Denies myalgias, back pain, joint swelling, arthralgias and gait problem.  Skin: Denies pallor, rash and wound.  Neurological: Denies dizziness, seizures, syncope, weakness, light-headedness, numbness and headaches.  Hematological: Denies adenopathy. Easy bruising, personal or family bleeding history  Psychiatric/Behavioral: Denies suicidal ideation, mood changes, confusion, nervousness, sleep disturbance and agitation    Physical Exam: Vitals:   11/04/19 1342  BP: 140/90  Pulse: 79  Temp: 98.3 F (36.8 C)  TempSrc: Oral  SpO2: 97%  Weight: 161 lb 4.8 oz (73.2 kg)    Body mass index is 26.84 kg/m.   Constitutional: NAD, calm, comfortable Eyes: PERRL, lids and conjunctivae normal ENMT: Mucous membranes are moist.  Skin: Significantly sized breast lump around the 9:00 area of her right breast Neurologic: Grossly intact and nonfocal.  Psychiatric: Normal judgment and insight. Alert and oriented x 3. Normal mood.  Impression and Plan:  Lump of right breast -Despite recent negative screening mammogram, send for diagnostic mammo and ultrasound.  Elevated BP without diagnosis of hypertension -Blood pressure again noted to be 140/90, she will continue to do ambulatory  measurements and will discuss at subsequent visit.    Patient Instructions  -Nice seeing you today!!  -We will request diagnostic imaging of your right breast to evaluate the lump.     Lelon Frohlich, MD [facility] at [facility]

## 2019-11-05 NOTE — Telephone Encounter (Signed)
Form completed.  Mailed to home address per patient request.  Copy made for chart.

## 2019-11-09 ENCOUNTER — Other Ambulatory Visit: Payer: Self-pay | Admitting: Internal Medicine

## 2019-11-09 DIAGNOSIS — N6310 Unspecified lump in the right breast, unspecified quadrant: Secondary | ICD-10-CM

## 2019-11-27 ENCOUNTER — Other Ambulatory Visit: Payer: Self-pay

## 2019-11-27 ENCOUNTER — Ambulatory Visit
Admission: RE | Admit: 2019-11-27 | Discharge: 2019-11-27 | Disposition: A | Discharge status: Home / Self Care | Payer: 59 | Source: Ambulatory Visit | Attending: Internal Medicine | Admitting: Internal Medicine

## 2019-11-27 DIAGNOSIS — N6310 Unspecified lump in the right breast, unspecified quadrant: Secondary | ICD-10-CM

## 2019-12-07 ENCOUNTER — Ambulatory Visit (AMBULATORY_SURGERY_CENTER): Payer: Self-pay | Admitting: *Deleted

## 2019-12-07 ENCOUNTER — Other Ambulatory Visit: Payer: Self-pay

## 2019-12-07 VITALS — Ht 65.0 in | Wt 161.0 lb

## 2019-12-07 DIAGNOSIS — Z1211 Encounter for screening for malignant neoplasm of colon: Secondary | ICD-10-CM

## 2019-12-07 DIAGNOSIS — Z01818 Encounter for other preprocedural examination: Secondary | ICD-10-CM

## 2019-12-07 MED ORDER — SUPREP BOWEL PREP KIT 17.5-3.13-1.6 GM/177ML PO SOLN: 1.0000 | Freq: Once | ORAL | 0 refills | Status: AC

## 2019-12-07 NOTE — Progress Notes (Signed)

## 2019-12-17 ENCOUNTER — Other Ambulatory Visit: Payer: Self-pay | Admitting: Gastroenterology

## 2019-12-17 LAB — SARS CORONAVIRUS 2 (TAT 6-24 HRS): SARS Coronavirus 2: NEGATIVE

## 2019-12-21 ENCOUNTER — Encounter: Payer: Self-pay | Admitting: Gastroenterology

## 2019-12-21 ENCOUNTER — Other Ambulatory Visit: Payer: Self-pay

## 2019-12-21 ENCOUNTER — Ambulatory Visit (AMBULATORY_SURGERY_CENTER): Payer: 59 | Admitting: Gastroenterology

## 2019-12-21 VITALS — BP 145/93 | HR 77 | Temp 97.8°F | Resp 13 | Ht 65.0 in | Wt 161.0 lb

## 2019-12-21 DIAGNOSIS — D122 Benign neoplasm of ascending colon: Secondary | ICD-10-CM

## 2019-12-21 DIAGNOSIS — D125 Benign neoplasm of sigmoid colon: Secondary | ICD-10-CM

## 2019-12-21 DIAGNOSIS — Z1211 Encounter for screening for malignant neoplasm of colon: Secondary | ICD-10-CM | POA: Diagnosis present

## 2019-12-21 MED ORDER — SODIUM CHLORIDE 0.9 % IV SOLN: 500.0000 mL | Freq: Once | INTRAVENOUS | Status: DC

## 2019-12-21 NOTE — Op Note (Signed)
[place] Patient Name: Joua Bake Procedure Date: 12/21/2019 1:33 PM MRN: [id number] Endoscopist: Ladene Artist , MD Age: 54 Referring MD:  Date of Birth: [birth year] Gender: Female Account #: [id number] Procedure:                Colonoscopy Indications:              Screening for colorectal malignant neoplasm Medicines:                Monitored Anesthesia Care Procedure:                Pre-Anesthesia Assessment:                           - Prior to the procedure, a History and Physical                            was performed, and patient medications and                            allergies were reviewed. The patient's tolerance of                            previous anesthesia was also reviewed. The risks                            and benefits of the procedure and the sedation                            options and risks were discussed with the patient.                            All questions were answered, and informed consent                            was obtained. Prior Anticoagulants: The patient has                            taken no previous anticoagulant or antiplatelet                            agents. ASA Grade Assessment: II - A patient with                            mild systemic disease. After reviewing the risks                            and benefits, the patient was deemed in                            satisfactory condition to undergo the procedure.                           After obtaining informed consent, the colonoscope  was passed under direct vision. Throughout the                            procedure, the patient's blood pressure, pulse, and                            oxygen saturations were monitored continuously. The                            Colonoscope was introduced through the anus and                            advanced to the the cecum, identified by                            appendiceal orifice and  ileocecal valve. The                            ileocecal valve, appendiceal orifice, and rectum                            were photographed. The quality of the bowel                            preparation was excellent. The colonoscopy was                            performed without difficulty. The patient tolerated                            the procedure well. Scope In: 1:36:15 PM Scope Out: 1:56:41 PM Scope Withdrawal Time: 0 hours 17 minutes 53 seconds  Total Procedure Duration: 0 hours 20 minutes 26 seconds  Findings:                 The perianal and digital rectal examinations were                            normal.                           Four sessile polyps were found in the sigmoid colon                            (3) and ascending colon (1). The polyps were 5 to 7                            mm in size. These polyps were removed with a cold                            snare. Resection and retrieval were complete.                           The exam was otherwise without abnormality on  direct and retroflexion views. Complications:            No immediate complications. Estimated blood loss:                            None. Estimated Blood Loss:     Estimated blood loss: none. Impression:               - Four 5 to 7 mm polyps in the sigmoid colon and in                            the ascending colon, removed with a cold snare.                            Resected and retrieved.                           - The examination was otherwise normal on direct                            and retroflexion views. Recommendation:           - Repeat colonoscopy after studies are complete for                            surveillance based on pathology results.                           - Patient has a contact number available for                            emergencies. The signs and symptoms of potential                            delayed complications were discussed  with the                            patient. Return to normal activities tomorrow.                            Written discharge instructions were provided to the                            patient.                           - Resume previous diet.                           - Continue present medications.                           - Await pathology results. Ladene Artist, MD 12/21/2019 1:59:08 PM This report has been signed electronically.

## 2019-12-21 NOTE — Progress Notes (Signed)
pt tolerated well. VSS. awake and to recovery. Report given to RN.  

## 2019-12-21 NOTE — Patient Instructions (Signed)
Handout given for polyps.  YOU HAD AN ENDOSCOPIC PROCEDURE TODAY AT THE  ENDOSCOPY CENTER:   Refer to the procedure report that was given to you for any specific questions about what was found during the examination.  If the procedure report does not answer your questions, please call your gastroenterologist to clarify.  If you requested that your care partner not be given the details of your procedure findings, then the procedure report has been included in a sealed envelope for you to review at your convenience later.  YOU SHOULD EXPECT: Some feelings of bloating in the abdomen. Passage of more gas than usual.  Walking can help get rid of the air that was put into your GI tract during the procedure and reduce the bloating. If you had a lower endoscopy (such as a colonoscopy or flexible sigmoidoscopy) you may notice spotting of blood in your stool or on the toilet paper. If you underwent a bowel prep for your procedure, you may not have a normal bowel movement for a few days.  Please Note:  You might notice some irritation and congestion in your nose or some drainage.  This is from the oxygen used during your procedure.  There is no need for concern and it should clear up in a day or so.  SYMPTOMS TO REPORT IMMEDIATELY:   Following lower endoscopy (colonoscopy or flexible sigmoidoscopy):  Excessive amounts of blood in the stool  Significant tenderness or worsening of abdominal pains  Swelling of the abdomen that is new, acute  Fever of 100F or higher  For urgent or emergent issues, a gastroenterologist can be reached at any hour by calling [phone]. Do not use MyChart messaging for urgent concerns.    DIET:  We do recommend a small meal at first, but then you may proceed to your regular diet.  Drink plenty of fluids but you should avoid alcoholic beverages for 24 hours.  ACTIVITY:  You should plan to take it easy for the rest of today and you should NOT DRIVE or use heavy  machinery until tomorrow (because of the sedation medicines used during the test).    FOLLOW UP: Our staff will call the number listed on your records 48-72 hours following your procedure to check on you and address any questions or concerns that you may have regarding the information given to you following your procedure. If we do not reach you, we will leave a message.  We will attempt to reach you two times.  During this call, we will ask if you have developed any symptoms of COVID 19. If you develop any symptoms (ie: fever, flu-like symptoms, shortness of breath, cough etc.) before then, please call [phone].  If you test positive for Covid 19 in the 2 weeks post procedure, please call and report this information to us.    If any biopsies were taken you will be contacted by phone or by letter within the next 1-3 weeks.  Please call us at [phone] if you have not heard about the biopsies in 3 weeks.    SIGNATURES/CONFIDENTIALITY: You and/or your care partner have signed paperwork which will be entered into your electronic medical record.  These signatures attest to the fact that that the information above on your After Visit Summary has been reviewed and is understood.  Full responsibility of the confidentiality of this discharge information lies with you and/or your care-partner. 

## 2019-12-21 NOTE — Progress Notes (Signed)
Pt. Reports no change in her medical or surgical history since her pre-visit 12/07/19.

## 2019-12-23 ENCOUNTER — Telehealth: Payer: Self-pay

## 2019-12-23 NOTE — Telephone Encounter (Signed)
°  Follow up Call-  Call back number 12/21/2019  Post procedure Call Back phone  # [phone]  Permission to leave phone message Yes  Some recent data might be hidden     Patient questions:  Do you have a fever, pain , or abdominal swelling? No. Pain Score  0 *  Have you tolerated food without any problems? Yes.    Have you been able to return to your normal activities? Yes.    Do you have any questions about your discharge instructions: Diet   No. Medications  No. Follow up visit  No.  Do you have questions or concerns about your Care? No.  Actions: * If pain score is 4 or above: No action needed, pain <4. 1. Have you developed a fever since your procedure? no  2.   Have you had an respiratory symptoms (SOB or cough) since your procedure? no  3.   Have you tested positive for COVID 19 since your procedure no  4.   Have you had any family members/close contacts diagnosed with the COVID 19 since your procedure?  no   If yes to any of these questions please route to Joylene John, RN and Joella Prince, RN

## 2019-12-23 NOTE — Telephone Encounter (Signed)
Left message on follow up call. 

## 2019-12-29 ENCOUNTER — Encounter: Payer: Self-pay | Admitting: Gastroenterology

## 2020-02-19 ENCOUNTER — Ambulatory Visit (INDEPENDENT_AMBULATORY_CARE_PROVIDER_SITE_OTHER): Payer: 59 | Admitting: *Deleted

## 2020-02-19 ENCOUNTER — Other Ambulatory Visit: Payer: Self-pay

## 2020-02-19 ENCOUNTER — Ambulatory Visit: Payer: 59 | Admitting: Internal Medicine

## 2020-02-19 ENCOUNTER — Other Ambulatory Visit (INDEPENDENT_AMBULATORY_CARE_PROVIDER_SITE_OTHER): Payer: 59

## 2020-02-19 DIAGNOSIS — Z23 Encounter for immunization: Secondary | ICD-10-CM

## 2020-02-19 DIAGNOSIS — Z0000 Encounter for general adult medical examination without abnormal findings: Secondary | ICD-10-CM

## 2020-02-19 NOTE — Addendum Note (Signed)
Addended by: Marrion Coy on: 02/19/2020 01:16 PM   Modules accepted: Orders

## 2020-02-20 LAB — COMPREHENSIVE METABOLIC PANEL
AG Ratio: 1.5 (calc) (ref 1.0–2.5)
ALT: 32 U/L — ABNORMAL HIGH (ref 6–29)
AST: 30 U/L (ref 10–35)
Albumin: 4.5 g/dL (ref 3.6–5.1)
Alkaline phosphatase (APISO): 83 U/L (ref 37–153)
BUN: 11 mg/dL (ref 7–25)
CO2: 29 mmol/L (ref 20–32)
Calcium: 10.2 mg/dL (ref 8.6–10.4)
Chloride: 102 mmol/L (ref 98–110)
Creat: 0.69 mg/dL (ref 0.50–1.05)
Globulin: 3 g/dL (calc) (ref 1.9–3.7)
Glucose, Bld: 96 mg/dL (ref 65–99)
Potassium: 4.8 mmol/L (ref 3.5–5.3)
Sodium: 141 mmol/L (ref 135–146)
Total Bilirubin: 1.1 mg/dL (ref 0.2–1.2)
Total Protein: 7.5 g/dL (ref 6.1–8.1)

## 2020-02-20 LAB — CBC WITH DIFFERENTIAL/PLATELET
Absolute Monocytes: 458 cells/uL (ref 200–950)
Basophils Absolute: 23 cells/uL (ref 0–200)
Basophils Relative: 0.3 %
Eosinophils Absolute: 158 cells/uL (ref 15–500)
Eosinophils Relative: 2.1 %
HCT: 41.5 % (ref 35.0–45.0)
Hemoglobin: 14.3 g/dL (ref 11.7–15.5)
Lymphs Abs: 1808 cells/uL (ref 850–3900)
MCH: 33.3 pg — ABNORMAL HIGH (ref 27.0–33.0)
MCHC: 34.5 g/dL (ref 32.0–36.0)
MCV: 96.7 fL (ref 80.0–100.0)
MPV: 11.1 fL (ref 7.5–12.5)
Monocytes Relative: 6.1 %
Neutro Abs: 5055 cells/uL (ref 1500–7800)
Neutrophils Relative %: 67.4 %
Platelets: 178 10*3/uL (ref 140–400)
RBC: 4.29 10*6/uL (ref 3.80–5.10)
RDW: 12.4 % (ref 11.0–15.0)
Total Lymphocyte: 24.1 %
WBC: 7.5 10*3/uL (ref 3.8–10.8)

## 2020-02-20 LAB — LIPID PANEL
Cholesterol: 258 mg/dL — ABNORMAL HIGH (ref <200)
HDL: 50 mg/dL (ref >=50)
LDL Cholesterol (Calc): 181 mg/dL (calc) — ABNORMAL HIGH
Non-HDL Cholesterol (Calc): 208 mg/dL (calc) — ABNORMAL HIGH (ref <130)
Total CHOL/HDL Ratio: 5.2 (calc) — ABNORMAL HIGH (ref <5.0)
Triglycerides: 134 mg/dL (ref <150)

## 2020-02-20 LAB — HEMOGLOBIN A1C
Hgb A1c MFr Bld: 5 % of total Hgb (ref <5.7)
Mean Plasma Glucose: 97 (calc)
eAG (mmol/L): 5.4 (calc)

## 2020-02-20 LAB — TSH: TSH: 5.16 mIU/L — ABNORMAL HIGH

## 2020-02-20 LAB — VITAMIN D 25 HYDROXY (VIT D DEFICIENCY, FRACTURES): Vit D, 25-Hydroxy: 37 ng/mL (ref 30–100)

## 2020-02-20 LAB — VITAMIN B12: Vitamin B-12: 605 pg/mL (ref 200–1100)

## 2020-02-23 ENCOUNTER — Encounter: Payer: Self-pay | Admitting: Internal Medicine

## 2020-02-23 ENCOUNTER — Other Ambulatory Visit: Payer: Self-pay | Admitting: Internal Medicine

## 2020-02-23 DIAGNOSIS — R7989 Other specified abnormal findings of blood chemistry: Secondary | ICD-10-CM | POA: Insufficient documentation

## 2020-02-23 DIAGNOSIS — E559 Vitamin D deficiency, unspecified: Secondary | ICD-10-CM | POA: Insufficient documentation

## 2020-02-23 DIAGNOSIS — E785 Hyperlipidemia, unspecified: Secondary | ICD-10-CM | POA: Insufficient documentation

## 2020-02-23 MED ORDER — VITAMIN D (ERGOCALCIFEROL) 1.25 MG (50000 UNIT) PO CAPS: 50000.0000 [IU] | ORAL_CAPSULE | ORAL | 0 refills | Status: AC

## 2020-02-23 MED ORDER — ATORVASTATIN CALCIUM 40 MG PO TABS: 40.0000 mg | ORAL_TABLET | Freq: Every day | ORAL | 1 refills | Status: DC

## 2020-02-24 ENCOUNTER — Other Ambulatory Visit: Payer: Self-pay | Admitting: Internal Medicine

## 2020-02-24 DIAGNOSIS — E785 Hyperlipidemia, unspecified: Secondary | ICD-10-CM

## 2020-02-24 DIAGNOSIS — E559 Vitamin D deficiency, unspecified: Secondary | ICD-10-CM

## 2020-02-25 ENCOUNTER — Other Ambulatory Visit: Payer: Self-pay

## 2020-02-25 ENCOUNTER — Other Ambulatory Visit: Payer: 59

## 2020-02-25 DIAGNOSIS — R7989 Other specified abnormal findings of blood chemistry: Secondary | ICD-10-CM

## 2020-02-25 DIAGNOSIS — E785 Hyperlipidemia, unspecified: Secondary | ICD-10-CM

## 2020-02-25 DIAGNOSIS — E559 Vitamin D deficiency, unspecified: Secondary | ICD-10-CM

## 2020-02-26 LAB — LIPID PANEL
Cholesterol: 255 mg/dL — ABNORMAL HIGH (ref <200)
HDL: 50 mg/dL (ref >=50)
LDL Cholesterol (Calc): 168 mg/dL (calc) — ABNORMAL HIGH
Non-HDL Cholesterol (Calc): 205 mg/dL (calc) — ABNORMAL HIGH (ref <130)
Total CHOL/HDL Ratio: 5.1 (calc) — ABNORMAL HIGH (ref <5.0)
Triglycerides: 221 mg/dL — ABNORMAL HIGH (ref <150)

## 2020-02-26 LAB — T4, FREE: Free T4: 0.9 ng/dL (ref 0.8–1.8)

## 2020-02-26 LAB — T3, FREE: T3, Free: 2.9 pg/mL (ref 2.3–4.2)

## 2020-02-26 LAB — VITAMIN D 25 HYDROXY (VIT D DEFICIENCY, FRACTURES): Vit D, 25-Hydroxy: 32 ng/mL (ref 30–100)

## 2020-02-29 ENCOUNTER — Other Ambulatory Visit: Payer: Self-pay | Admitting: Internal Medicine

## 2020-02-29 DIAGNOSIS — R7989 Other specified abnormal findings of blood chemistry: Secondary | ICD-10-CM

## 2020-02-29 NOTE — Addendum Note (Signed)
Addended by: Marrion Coy on: 02/29/2020 03:45 PM   Modules accepted: Orders

## 2020-03-19 HISTORY — PX: COLONOSCOPY: SHX174

## 2020-04-07 ENCOUNTER — Other Ambulatory Visit: Payer: Self-pay

## 2020-04-07 ENCOUNTER — Other Ambulatory Visit (INDEPENDENT_AMBULATORY_CARE_PROVIDER_SITE_OTHER): Payer: 59

## 2020-04-07 DIAGNOSIS — R7989 Other specified abnormal findings of blood chemistry: Secondary | ICD-10-CM | POA: Diagnosis not present

## 2020-04-07 LAB — T4, FREE: Free T4: 0.68 ng/dL (ref 0.60–1.60)

## 2020-04-07 LAB — TSH: TSH: 3.75 u[IU]/mL (ref 0.35–4.50)

## 2020-04-07 LAB — T3, FREE: T3, Free: 3.6 pg/mL (ref 2.3–4.2)

## 2020-05-27 ENCOUNTER — Other Ambulatory Visit (INDEPENDENT_AMBULATORY_CARE_PROVIDER_SITE_OTHER): Payer: 59

## 2020-05-27 ENCOUNTER — Other Ambulatory Visit: Payer: Self-pay

## 2020-05-27 DIAGNOSIS — E559 Vitamin D deficiency, unspecified: Secondary | ICD-10-CM | POA: Diagnosis not present

## 2020-05-27 LAB — VITAMIN D 25 HYDROXY (VIT D DEFICIENCY, FRACTURES): VITD: 57.01 ng/mL (ref 30.00–100.00)

## 2020-05-27 NOTE — Progress Notes (Signed)
[facility]

## 2020-07-18 ENCOUNTER — Telehealth: Payer: Self-pay | Admitting: Internal Medicine

## 2020-07-18 NOTE — Telephone Encounter (Signed)
Spoke with the pt for more information.  Patient denies taking any BP medications before, also denies any chest pain, dizziness, headache or shortness of breath nor other symptoms.  I advised the pt she should go to an urgent care as we do not have any openings today.  Patient asked if medication could be sent in and I advised her a visit is needed by a provider and to check her blood pressure.  Offered an appt with Dr Elease Hashimoto tomorrow to arrive at [place] and the pt declined as she has to work.  Offered another appt with Dr Sarajane Jews for tomorrow to arrive at 10:45 and the pt agreed to this time.  Also advised the pt if she develops any of the symptoms I mentioned earlier in the meantime, she should go to an urgent care or ER immediately for an evaluation.  Message sent to Dr Elease Hashimoto for review as PCP is out of the office.

## 2020-07-18 NOTE — Telephone Encounter (Signed)
Patient is calling and stated that she has been monitoring her BP and it has been elevated running from 160/90- 180/112 at times and wanted to see if provider wanted to start her on blood pressure medication. Pt uses   [facility] [address] [place] [address]  Phone:  [phone] Fax:  [phone]  CB is [phone]

## 2020-07-18 NOTE — Telephone Encounter (Signed)
Noted  

## 2020-07-19 ENCOUNTER — Ambulatory Visit (INDEPENDENT_AMBULATORY_CARE_PROVIDER_SITE_OTHER): Payer: 59 | Admitting: Family Medicine

## 2020-07-19 ENCOUNTER — Other Ambulatory Visit: Payer: Self-pay

## 2020-07-19 ENCOUNTER — Encounter: Payer: Self-pay | Admitting: Family Medicine

## 2020-07-19 VITALS — BP 148/90 | HR 89 | Temp 98.4°F | Wt 146.0 lb

## 2020-07-19 DIAGNOSIS — I10 Essential (primary) hypertension: Secondary | ICD-10-CM | POA: Diagnosis not present

## 2020-07-19 MED ORDER — LISINOPRIL 10 MG PO TABS: 10.0000 mg | ORAL_TABLET | Freq: Every day | ORAL | 0 refills | Status: DC

## 2020-07-19 NOTE — Progress Notes (Signed)
   Subjective:    Patient ID: [name], female    DOB: September 07, 1965, 55 y.o.   MRN: [id number]  HPI Here for elevated BP. This started running high about a year ago, usually in the 140s over 90s. Now for the past few weeks she has had some days where she feels lightheaded and she feels her heart pounding in her chest. No chest pai or SOB. She quit smoking 15 years ago. She has lost 15 lbs in the past year. She had labs showing normal renal function last December and a normal thyroid panel in January.    Review of Systems  Constitutional: Negative.   Respiratory: Negative.   Cardiovascular: Positive for palpitations. Negative for chest pain and leg swelling.  Neurological: Positive for light-headedness and headaches.       Objective:   Physical Exam Constitutional:      Appearance: Normal appearance. She is not ill-appearing.  Cardiovascular:     Rate and Rhythm: Normal rate and regular rhythm.     Pulses: Normal pulses.     Heart sounds: Normal heart sounds.  Pulmonary:     Effort: Pulmonary effort is normal.     Breath sounds: Normal breath sounds.  Musculoskeletal:     Right lower leg: No edema.     Left lower leg: No edema.  Lymphadenopathy:     Cervical: No cervical adenopathy.  Neurological:     Mental Status: She is alert.           Assessment & Plan:  HTN, we will treat this with Lisinopril 10 mg daily. She will follow up with Dr. Jerilee Hoh, her PCP, in 3-4 weeks.  Alysia Penna, MD

## 2020-08-11 ENCOUNTER — Other Ambulatory Visit: Payer: Self-pay

## 2020-08-11 ENCOUNTER — Ambulatory Visit (INDEPENDENT_AMBULATORY_CARE_PROVIDER_SITE_OTHER): Payer: 59 | Admitting: Internal Medicine

## 2020-08-11 ENCOUNTER — Encounter: Payer: Self-pay | Admitting: Internal Medicine

## 2020-08-11 VITALS — BP 120/84 | HR 80 | Temp 98.1°F | Wt 150.2 lb

## 2020-08-11 DIAGNOSIS — I10 Essential (primary) hypertension: Secondary | ICD-10-CM | POA: Diagnosis not present

## 2020-08-11 NOTE — Progress Notes (Signed)
Established Patient Office Visit     This visit occurred during the SARS-CoV-2 public health emergency.  Safety protocols were in place, including screening questions prior to the visit, additional usage of staff PPE, and extensive cleaning of exam room while observing appropriate contact time as indicated for disinfecting solutions.    CC/Reason for Visit: Follow up HTN  HPI: [name] is a 55 y.o. female who is coming in today for the above mentioned reasons. Was diagnosed with HTN 3 weeks ago by Dr. Sarajane Jews and started on lisinopril 10 mg daily. Home BP have been improved. She has been tolerating medication well and has no acute complaints today.  Past Medical/Surgical History: Past Medical History:  Diagnosis Date  . Allergy   . Anxiety   . Depression   . GERD (gastroesophageal reflux disease)    diet related     Past Surgical History:  Procedure Laterality Date  . WISDOM TOOTH EXTRACTION      Social History:  reports that she has quit smoking. She has never used smokeless tobacco. She reports current alcohol use of about 14.0 standard drinks of alcohol per week. She reports that she does not use drugs.  Allergies: Allergies  Allergen Reactions  . Sulfa Antibiotics Hives    Family History:  Family History  Problem Relation Age of Onset  . Hypertension Mother   . Stomach cancer Maternal Grandmother   . CAD Maternal Grandfather   . Heart attack Maternal Grandfather   . Breast cancer Maternal Aunt 80  . Cancer Father        Skin  . Colon cancer Neg Hx   . Colon polyps Neg Hx   . Esophageal cancer Neg Hx   . Rectal cancer Neg Hx      Current Outpatient Medications:  .  atorvastatin (LIPITOR) 40 MG tablet, Take 1 tablet (40 mg total) by mouth daily., Disp: 90 tablet, Rfl: 1 .  lisinopril (ZESTRIL) 10 MG tablet, Take 1 tablet (10 mg total) by mouth daily., Disp: 30 tablet, Rfl: 0 .  milk thistle 175 MG tablet, Take 175 mg by mouth daily., Disp: ,  Rfl:  .  Multiple Vitamin (MULTIVITAMIN) tablet, Take 1 tablet by mouth daily., Disp: , Rfl:  .  Probiotic Product (PROBIOTIC PO), Take by mouth., Disp: , Rfl:  .  TURMERIC PO, Take by mouth., Disp: , Rfl:   Review of Systems:  Constitutional: Denies fever, chills, diaphoresis, appetite change and fatigue.  HEENT: Denies photophobia, eye pain, redness, hearing loss, ear pain, congestion, sore throat, rhinorrhea, sneezing, mouth sores, trouble swallowing, neck pain, neck stiffness and tinnitus.   Respiratory: Denies SOB, DOE, cough, chest tightness,  and wheezing.   Cardiovascular: Denies chest pain, palpitations and leg swelling.  Gastrointestinal: Denies nausea, vomiting, abdominal pain, diarrhea, constipation, blood in stool and abdominal distention.  Genitourinary: Denies dysuria, urgency, frequency, hematuria, flank pain and difficulty urinating.  Endocrine: Denies: hot or cold intolerance, sweats, changes in hair or nails, polyuria, polydipsia. Musculoskeletal: Denies myalgias, back pain, joint swelling, arthralgias and gait problem.  Skin: Denies pallor, rash and wound.  Neurological: Denies dizziness, seizures, syncope, weakness, light-headedness, numbness and headaches.  Hematological: Denies adenopathy. Easy bruising, personal or family bleeding history  Psychiatric/Behavioral: Denies suicidal ideation, mood changes, confusion, nervousness, sleep disturbance and agitation    Physical Exam: Vitals:   08/11/20 1405  BP: 120/84  Pulse: 80  Temp: 98.1 F (36.7 C)  TempSrc: Oral  SpO2: 99%  Weight:  150 lb 3.2 oz (68.1 kg)    Body mass index is 24.99 kg/m.   Constitutional: NAD, calm, comfortable Eyes: PERRL, lids and conjunctivae normal ENMT: Mucous membranes are moist.  Respiratory: clear to auscultation bilaterally, no wheezing, no crackles. Normal respiratory effort. No accessory muscle use.  Cardiovascular: Regular rate and rhythm, no murmurs / rubs / gallops. No  extremity edema.  Neurologic: grossly intact and non-focal Psychiatric: Normal judgment and insight. Alert and oriented x 3. Normal mood.    Impression and Plan:  Primary hypertension -BP improved and well-controlled on lisinopril 10 mg daily.    Lelon Frohlich, MD [facility]

## 2020-08-12 ENCOUNTER — Other Ambulatory Visit: Payer: Self-pay | Admitting: Family Medicine

## 2020-08-26 ENCOUNTER — Other Ambulatory Visit: Payer: Self-pay

## 2020-09-16 ENCOUNTER — Telehealth: Payer: Self-pay | Admitting: Internal Medicine

## 2020-09-16 MED ORDER — LISINOPRIL 10 MG PO TABS: 10.0000 mg | ORAL_TABLET | Freq: Every day | ORAL | 1 refills | Status: DC

## 2020-09-16 NOTE — Telephone Encounter (Signed)
lisinopril (ZESTRIL) 10 MG tablet  [facility] [id number], [place] Phone:  [phone]  Fax:  [phone]     The patient stated that Dr. Jerilee Hoh told her that ather last visit that she was going to send in enough refills to last her till September. Apparently Dr. Jerilee Hoh did not send in the refills.  Please advise

## 2020-09-16 NOTE — Telephone Encounter (Signed)
Refill sent.

## 2020-10-05 ENCOUNTER — Encounter: Payer: Self-pay | Admitting: Nurse Practitioner

## 2020-10-05 ENCOUNTER — Ambulatory Visit (INDEPENDENT_AMBULATORY_CARE_PROVIDER_SITE_OTHER): Payer: 59 | Admitting: Nurse Practitioner

## 2020-10-05 ENCOUNTER — Other Ambulatory Visit: Payer: Self-pay

## 2020-10-05 VITALS — BP 120/76 | Ht 65.5 in | Wt 157.0 lb

## 2020-10-05 DIAGNOSIS — Z01419 Encounter for gynecological examination (general) (routine) without abnormal findings: Secondary | ICD-10-CM | POA: Diagnosis not present

## 2020-10-05 DIAGNOSIS — Z780 Asymptomatic menopausal state: Secondary | ICD-10-CM

## 2020-10-05 NOTE — Progress Notes (Signed)
   [name] [birth year] [id number]   History:  55 y.o. G4P4 presents for annual exam without GYN complaints. IUD removed 1 year ago, amenorrheic since, [facility] at that time. Denies menopausal symptoms. History of ASCUS many years ago per patient, otherwise normal pap history. Sees psychiatry for depression and anxiety.   Gynecologic History No LMP recorded (lmp unknown). Patient is postmenopausal.   Contraception: post menopausal status  Health maintenance Last Pap: 10/05/2019. Results were: Normal, 5-year repeat Last mammogram: 11/27/2019. Results were: normal Last colonoscopy: 12/2019 Last Dexa: Not indicated  Past medical history, past surgical history, family history and social history were all reviewed and documented in the EPIC chart. Married. 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 4 children ages 21, 56, 74, and 62.   ROS:  A ROS was performed and pertinent positives and negatives are included.  Exam:  Vitals:   10/05/20 1607  BP: 120/76  Weight: 157 lb (71.2 kg)  Height: 5' 5.5" (1.664 m)    Body mass index is 25.73 kg/m.  General appearance:  Normal Thyroid:  Symmetrical, normal in size, without palpable masses or nodularity. Respiratory  Auscultation:  Clear without wheezing or rhonchi Cardiovascular  Auscultation:  Regular rate, without rubs, murmurs or gallops  Edema/varicosities:  Not grossly evident Abdominal  Soft,nontender, without masses, guarding or rebound.  Liver/spleen:  No organomegaly noted  Hernia:  None appreciated  Skin  Inspection:  Grossly normal   Breasts: Examined lying and sitting.   Right: Without masses, retractions, discharge or axillary adenopathy.   Left: Without masses, retractions, discharge or axillary adenopathy. Gentitourinary   Inguinal/mons:  Normal without inguinal adenopathy  External genitalia:  Normal  BUS/Urethra/Skene's glands:  Normal  Vagina:  Normal  Cervix:  Normal  Uterus:  Anteverted, normal in size,  shape and contour.  Midline and mobile  Adnexa/parametria:     Rt: Without masses or tenderness.   Lt: Without masses or tenderness.  Anus and perineum: Normal  Digital rectal exam: Normal sphincter tone without palpated masses or tenderness  Assessment/Plan:  55 y.o. G4P4 as new patient.   Well female exam with routine gynecological exam - Education provided on SBEs, importance of preventative screenings, current guidelines, high calcium diet, regular exercise, and multivitamin daily. Labs with PCP.   Postmenopausal - No HRT, no bleeding. IUD removed 1 year ago, amenorrheic since. [place] 32 at that time.   Screening for cervical cancer - Normal Pap history.  Will repeat at 5-year interval per guidelines.  Screening for breast cancer - Normal mammogram history.  Continue annual screenings.  Normal breast exam today.  Screening for colon cancer - 2021 colonoscopy. Will repeat at GI's recommended interval.   Screening for osteoporosis - average risk. Will begin screenings at age 23.   Follow up in 1 year for annual       [place], 4:15 PM 10/05/2020

## 2020-10-25 ENCOUNTER — Other Ambulatory Visit: Payer: Self-pay

## 2020-10-25 ENCOUNTER — Ambulatory Visit
Admission: RE | Admit: 2020-10-25 | Discharge: 2020-10-25 | Disposition: A | Discharge status: Home / Self Care | Payer: 59 | Source: Ambulatory Visit | Attending: Internal Medicine | Admitting: Internal Medicine

## 2020-10-25 ENCOUNTER — Other Ambulatory Visit: Payer: Self-pay | Admitting: Internal Medicine

## 2020-10-25 DIAGNOSIS — Z1231 Encounter for screening mammogram for malignant neoplasm of breast: Secondary | ICD-10-CM

## 2020-11-23 ENCOUNTER — Other Ambulatory Visit: Payer: Self-pay

## 2020-11-24 ENCOUNTER — Encounter: Payer: Self-pay | Admitting: Internal Medicine

## 2020-11-24 ENCOUNTER — Ambulatory Visit (INDEPENDENT_AMBULATORY_CARE_PROVIDER_SITE_OTHER): Payer: 59 | Admitting: Internal Medicine

## 2020-11-24 VITALS — BP 110/80 | HR 74 | Temp 98.2°F | Ht 66.0 in | Wt 160.4 lb

## 2020-11-24 DIAGNOSIS — Z0000 Encounter for general adult medical examination without abnormal findings: Secondary | ICD-10-CM

## 2020-11-24 DIAGNOSIS — E785 Hyperlipidemia, unspecified: Secondary | ICD-10-CM

## 2020-11-24 DIAGNOSIS — E559 Vitamin D deficiency, unspecified: Secondary | ICD-10-CM | POA: Diagnosis not present

## 2020-11-24 DIAGNOSIS — R7989 Other specified abnormal findings of blood chemistry: Secondary | ICD-10-CM

## 2020-11-24 DIAGNOSIS — Z23 Encounter for immunization: Secondary | ICD-10-CM | POA: Diagnosis not present

## 2020-11-24 DIAGNOSIS — I10 Essential (primary) hypertension: Secondary | ICD-10-CM

## 2020-11-24 LAB — LIPID PANEL
Cholesterol: 130 mg/dL (ref 0–200)
HDL: 47.8 mg/dL (ref >39.00)
LDL Cholesterol: 45 mg/dL (ref 0–99)
NonHDL: 82.51
Total CHOL/HDL Ratio: 3
Triglycerides: 188 mg/dL — ABNORMAL HIGH (ref 0.0–149.0)
VLDL: 37.6 mg/dL (ref 0.0–40.0)

## 2020-11-24 LAB — CBC WITH DIFFERENTIAL/PLATELET
Basophils Absolute: 0 10*3/uL (ref 0.0–0.1)
Basophils Relative: 0.2 % (ref 0.0–3.0)
Eosinophils Absolute: 0.1 10*3/uL (ref 0.0–0.7)
Eosinophils Relative: 1.3 % (ref 0.0–5.0)
HCT: 39.8 % (ref 36.0–46.0)
Hemoglobin: 13.7 g/dL (ref 12.0–15.0)
Lymphocytes Relative: 18.7 % (ref 12.0–46.0)
Lymphs Abs: 1.3 10*3/uL (ref 0.7–4.0)
MCHC: 34.3 g/dL (ref 30.0–36.0)
MCV: 97.8 fl (ref 78.0–100.0)
Monocytes Absolute: 0.3 10*3/uL (ref 0.1–1.0)
Monocytes Relative: 4.1 % (ref 3.0–12.0)
Neutro Abs: 5.3 10*3/uL (ref 1.4–7.7)
Neutrophils Relative %: 75.7 % (ref 43.0–77.0)
Platelets: 135 10*3/uL — ABNORMAL LOW (ref 150.0–400.0)
RBC: 4.07 Mil/uL (ref 3.87–5.11)
RDW: 12 % (ref 11.5–15.5)
WBC: 7 10*3/uL (ref 4.0–10.5)

## 2020-11-24 LAB — COMPREHENSIVE METABOLIC PANEL
ALT: 34 U/L (ref 0–35)
AST: 31 U/L (ref 0–37)
Albumin: 4.2 g/dL (ref 3.5–5.2)
Alkaline Phosphatase: 71 U/L (ref 39–117)
BUN: 9 mg/dL (ref 6–23)
CO2: 28 mEq/L (ref 19–32)
Calcium: 9.2 mg/dL (ref 8.4–10.5)
Chloride: 102 mEq/L (ref 96–112)
Creatinine, Ser: 0.73 mg/dL (ref 0.40–1.20)
GFR: 93.48 mL/min (ref >60.00)
Glucose, Bld: 84 mg/dL (ref 70–99)
Potassium: 4.4 mEq/L (ref 3.5–5.1)
Sodium: 138 mEq/L (ref 135–145)
Total Bilirubin: 1.1 mg/dL (ref 0.2–1.2)
Total Protein: 6.9 g/dL (ref 6.0–8.3)

## 2020-11-24 LAB — HEMOGLOBIN A1C: Hgb A1c MFr Bld: 5.3 % (ref 4.6–6.5)

## 2020-11-24 LAB — TSH: TSH: 3.62 u[IU]/mL (ref 0.35–5.50)

## 2020-11-24 LAB — VITAMIN D 25 HYDROXY (VIT D DEFICIENCY, FRACTURES): VITD: 33.77 ng/mL (ref 30.00–100.00)

## 2020-11-24 MED ORDER — LISINOPRIL 10 MG PO TABS: 10.0000 mg | ORAL_TABLET | Freq: Every day | ORAL | 1 refills | Status: DC

## 2020-11-24 MED ORDER — ATORVASTATIN CALCIUM 40 MG PO TABS: 40.0000 mg | ORAL_TABLET | Freq: Every day | ORAL | 1 refills | Status: DC

## 2020-11-24 NOTE — Addendum Note (Signed)
Addended by: Amanda Cockayne on: 11/24/2020 11:14 AM   Modules accepted: Orders

## 2020-11-24 NOTE — Addendum Note (Signed)
Addended by: Westley Hummer B on: 11/24/2020 12:06 PM   Modules accepted: Orders

## 2020-11-24 NOTE — Progress Notes (Signed)
Established Patient Office Visit     This visit occurred during the SARS-CoV-2 public health emergency.  Safety protocols were in place, including screening questions prior to the visit, additional usage of staff PPE, and extensive cleaning of exam room while observing appropriate contact time as indicated for disinfecting solutions.    CC/Reason for Visit: Annual preventive exam  HPI: [name] is a 55 y.o. female who is coming in today for the above mentioned reasons. Past Medical History is significant for: Hypertension, hyperlipidemia, vitamin D deficiency and history of abnormal TSH in the past.  She has routine dental care but no eye care.  She exercises by walking about 4 days a week and doing strength training.  She had a colonoscopy in 2021 and is a 5-year callback.  She had a normal mammogram last month.  She had a Pap smear with her GYN earlier this year.  She is overdue for flu and COVID vaccinations.  She is feeling well and has no acute concerns today.   Past Medical/Surgical History: Past Medical History:  Diagnosis Date   Allergy    Anxiety    Depression    GERD (gastroesophageal reflux disease)    diet related     Past Surgical History:  Procedure Laterality Date   WISDOM TOOTH EXTRACTION      Social History:  reports that she has quit smoking. She has never used smokeless tobacco. She reports current alcohol use of about 14.0 standard drinks per week. She reports that she does not use drugs.  Allergies: Allergies  Allergen Reactions   Sulfa Antibiotics Hives    Family History:  Family History  Problem Relation Age of Onset   Hypertension Mother    Stomach cancer Maternal Grandmother    CAD Maternal Grandfather    Heart attack Maternal Grandfather    Breast cancer Maternal Aunt 74   Cancer Father        Skin   Colon cancer Neg Hx    Colon polyps Neg Hx    Esophageal cancer Neg Hx    Rectal cancer Neg Hx      Current Outpatient  Medications:    milk thistle 175 MG tablet, Take 175 mg by mouth daily., Disp: , Rfl:    Multiple Vitamin (MULTIVITAMIN) tablet, Take 1 tablet by mouth daily., Disp: , Rfl:    Probiotic Product (PROBIOTIC PO), Take by mouth., Disp: , Rfl:    TURMERIC PO, Take by mouth., Disp: , Rfl:    atorvastatin (LIPITOR) 40 MG tablet, Take 1 tablet (40 mg total) by mouth daily., Disp: 90 tablet, Rfl: 1   lisinopril (ZESTRIL) 10 MG tablet, Take 1 tablet (10 mg total) by mouth daily., Disp: 90 tablet, Rfl: 1  Review of Systems:  Constitutional: Denies fever, chills, diaphoresis, appetite change and fatigue.  HEENT: Denies photophobia, eye pain, redness, hearing loss, ear pain, congestion, sore throat, rhinorrhea, sneezing, mouth sores, trouble swallowing, neck pain, neck stiffness and tinnitus.   Respiratory: Denies SOB, DOE, cough, chest tightness,  and wheezing.   Cardiovascular: Denies chest pain, palpitations and leg swelling.  Gastrointestinal: Denies nausea, vomiting, abdominal pain, diarrhea, constipation, blood in stool and abdominal distention.  Genitourinary: Denies dysuria, urgency, frequency, hematuria, flank pain and difficulty urinating.  Endocrine: Denies: hot or cold intolerance, sweats, changes in hair or nails, polyuria, polydipsia. Musculoskeletal: Denies myalgias, back pain, joint swelling, arthralgias and gait problem.  Skin: Denies pallor, rash and wound.  Neurological: Denies dizziness, seizures,  syncope, weakness, light-headedness, numbness and headaches.  Hematological: Denies adenopathy. Easy bruising, personal or family bleeding history  Psychiatric/Behavioral: Denies suicidal ideation, mood changes, confusion, nervousness, sleep disturbance and agitation    Physical Exam: Vitals:   11/24/20 1046  BP: 110/80  Pulse: 74  Temp: 98.2 F (36.8 C)  TempSrc: Oral  SpO2: 98%  Weight: 160 lb 6.4 oz (72.8 kg)  Height: '5\' 6"'$  (1.676 m)    Body mass index is 25.89  kg/m.   Constitutional: NAD, calm, comfortable Eyes: PERRL, lids and conjunctivae normal, wears corrective lenses ENMT: Mucous membranes are moist. Posterior pharynx clear of any exudate or lesions. Normal dentition. Tympanic membrane is pearly white, no erythema or bulging. Neck: normal, supple, no masses, no thyromegaly Respiratory: clear to auscultation bilaterally, no wheezing, no crackles. Normal respiratory effort. No accessory muscle use.  Cardiovascular: Regular rate and rhythm, no murmurs / rubs / gallops. No extremity edema. 2+ pedal pulses. No carotid bruits.  Abdomen: no tenderness, no masses palpated. No hepatosplenomegaly. Bowel sounds positive.  Musculoskeletal: no clubbing / cyanosis. No joint deformity upper and lower extremities. Good ROM, no contractures. Normal muscle tone.  Skin: no rashes, lesions, ulcers. No induration Neurologic: CN 2-12 grossly intact. Sensation intact, DTR normal. Strength 5/5 in all 4.  Psychiatric: Normal judgment and insight. Alert and oriented x 3. Normal mood.    Impression and Plan:  Encounter for preventive health examination -Advised routine eye and dental care. -Flu vaccine in office today, she has not yet had COVID vaccinations and is declining today, Tdap and shingles are up-to-date. -She had a colonoscopy in 2021 and is a 5-year callback. -She had a mammogram in August 2022. -She had a Pap smear with her GYN earlier this year. -Screening labs today. -Healthy lifestyle discussed in detail.  Hyperlipidemia, unspecified hyperlipidemia type  - Plan: atorvastatin (LIPITOR) 40 MG tablet, Lipid panel -Last lipids in December 2021 with a total cholesterol of 225, triglycerides 221 and LDL 168.  Vitamin D deficiency  - Plan: VITAMIN D 25 Hydroxy (Vit-D Deficiency, Fractures)  Primary hypertension  - Plan: lisinopril (ZESTRIL) 10 MG tablet, CBC with Differential/Platelet, Comprehensive metabolic panel,  -Blood pressures well  controlled.  Abnormal TSH  - Plan: TSH  Need for influenza vaccination -Flu vaccine administered today.    Patient Instructions  -Nice seeing you today!!  -Lab work today; will notify you once results are available.  -Flu vaccine today.  -remember your COVID vaccines.  -Schedule follow up in 6 months.    Lelon Frohlich, MD [facility]

## 2020-11-24 NOTE — Patient Instructions (Signed)
-  Nice seeing you today!!  -Lab work today; will notify you once results are available.  -Flu vaccine today.  -remember your COVID vaccines.  -Schedule follow up in 6 months.

## 2021-03-01 ENCOUNTER — Encounter: Payer: Self-pay | Admitting: Internal Medicine

## 2021-03-01 ENCOUNTER — Ambulatory Visit (INDEPENDENT_AMBULATORY_CARE_PROVIDER_SITE_OTHER): Payer: 59 | Admitting: Internal Medicine

## 2021-03-01 VITALS — BP 110/80 | HR 100 | Temp 98.3°F | Wt 155.6 lb

## 2021-03-01 DIAGNOSIS — H938X2 Other specified disorders of left ear: Secondary | ICD-10-CM | POA: Diagnosis not present

## 2021-03-01 DIAGNOSIS — R058 Other specified cough: Secondary | ICD-10-CM | POA: Diagnosis not present

## 2021-03-01 DIAGNOSIS — I10 Essential (primary) hypertension: Secondary | ICD-10-CM | POA: Diagnosis not present

## 2021-03-01 DIAGNOSIS — T464X5A Adverse effect of angiotensin-converting-enzyme inhibitors, initial encounter: Secondary | ICD-10-CM

## 2021-03-01 MED ORDER — LOSARTAN POTASSIUM 50 MG PO TABS: 50.0000 mg | ORAL_TABLET | Freq: Every day | ORAL | 1 refills | Status: DC

## 2021-03-01 NOTE — Progress Notes (Signed)
Acute office Visit     This visit occurred during the SARS-CoV-2 public health emergency.  Safety protocols were in place, including screening questions prior to the visit, additional usage of staff PPE, and extensive cleaning of exam room while observing appropriate contact time as indicated for disinfecting solutions.    CC/Reason for Visit: Discuss some acute concerns  HPI: [name] is a 55 y.o. female who is coming in today for the above mentioned reasons.  She has scheduled this visit to discuss 2 main issues:  1.  For about 6 weeks she has had a sensation of fluid and fullness in her left ear.  She does recall an upper respiratory infection prior to this.  2.  She has been having a dry cough for months, it started after being prescribed lisinopril for hypertension.  Past Medical/Surgical History: Past Medical History:  Diagnosis Date   Allergy    Anxiety    Depression    GERD (gastroesophageal reflux disease)    diet related     Past Surgical History:  Procedure Laterality Date   WISDOM TOOTH EXTRACTION      Social History:  reports that she has quit smoking. She has never used smokeless tobacco. She reports current alcohol use of about 14.0 standard drinks per week. She reports that she does not use drugs.  Allergies: Allergies  Allergen Reactions   Lisinopril Cough   Sulfa Antibiotics Hives    Family History:  Family History  Problem Relation Age of Onset   Hypertension Mother    Stomach cancer Maternal Grandmother    CAD Maternal Grandfather    Heart attack Maternal Grandfather    Breast cancer Maternal Aunt 32   Cancer Father        Skin   Colon cancer Neg Hx    Colon polyps Neg Hx    Esophageal cancer Neg Hx    Rectal cancer Neg Hx      Current Outpatient Medications:    atorvastatin (LIPITOR) 40 MG tablet, Take 1 tablet (40 mg total) by mouth daily., Disp: 90 tablet, Rfl: 1   glucosamine-chondroitin 500-400 MG tablet, Take 1  tablet by mouth daily., Disp: , Rfl:    losartan (COZAAR) 50 MG tablet, Take 1 tablet (50 mg total) by mouth daily., Disp: 90 tablet, Rfl: 1   milk thistle 175 MG tablet, Take 175 mg by mouth daily., Disp: , Rfl:    Multiple Vitamin (MULTIVITAMIN) tablet, Take 1 tablet by mouth daily., Disp: , Rfl:    Omega-3 Fatty Acids (FISH OIL) 1000 MG CAPS, Take by mouth., Disp: , Rfl:    Probiotic Product (PROBIOTIC PO), Take by mouth., Disp: , Rfl:    TURMERIC PO, Take by mouth., Disp: , Rfl:   Review of Systems:  Constitutional: Denies fever, chills, diaphoresis, appetite change and fatigue.  HEENT: Denies photophobia, eye pain, redness,  mouth sores, trouble swallowing, neck pain, neck stiffness and tinnitus.   Respiratory: Denies SOB, DOE,  chest tightness,  and wheezing.   Cardiovascular: Denies chest pain, palpitations and leg swelling.  Gastrointestinal: Denies nausea, vomiting, abdominal pain, diarrhea, constipation, blood in stool and abdominal distention.  Genitourinary: Denies dysuria, urgency, frequency, hematuria, flank pain and difficulty urinating.  Endocrine: Denies: hot or cold intolerance, sweats, changes in hair or nails, polyuria, polydipsia. Musculoskeletal: Denies myalgias, back pain, joint swelling, arthralgias and gait problem.  Skin: Denies pallor, rash and wound.  Neurological: Denies dizziness, seizures, syncope, weakness, light-headedness, numbness and  headaches.  Hematological: Denies adenopathy. Easy bruising, personal or family bleeding history  Psychiatric/Behavioral: Denies suicidal ideation, mood changes, confusion, nervousness, sleep disturbance and agitation    Physical Exam: Vitals:   03/01/21 1108  BP: 110/80  Pulse: 100  Temp: 98.3 F (36.8 C)  TempSrc: Oral  SpO2: 99%  Weight: 155 lb 9.6 oz (70.6 kg)    Body mass index is 25.11 kg/m.   Constitutional: NAD, calm, comfortable Eyes: PERRL, lids and conjunctivae normal, wears corrective lenses,  dullness to tympanic membrane, slight erythema on the left but no signs of otitis media. ENMT: Mucous membranes are moist.  Respiratory: clear to auscultation bilaterally, no wheezing, no crackles. Normal respiratory effort. No accessory muscle use.  Cardiovascular: Regular rate and rhythm, no murmurs / rubs / gallops. No extremity edema.  Neurologic: Grossly intact and nonfocal Psychiatric: Normal judgment and insight. Alert and oriented x 3. Normal mood.    Impression and Plan:  Sensation of fullness in left ear -With dullness of TM, suspect fluid behind, likely from URI preceding.  No sign of otitis media. -Advised antihistamine and guaifenesin.  Cough due to ACE inhibitor Primary hypertension  - Plan: losartan (COZAAR) 50 MG tablet -DC lisinopril and start losartan, return in 6 to 8 weeks for follow-up.  Time spent: 21 minutes reviewing chart, interviewing and examining patient and formulating plan of care.   Patient Instructions  -Nice seeing you today!!  -Stop lisinopril.  -Start losartan 50 mg daily.  -Try zyrtec daily and mucinex twice daily for around 14 days.   Lelon Frohlich, MD [facility]

## 2021-03-01 NOTE — Patient Instructions (Addendum)
-  Nice seeing you today!!  -Stop lisinopril.  -Start losartan 50 mg daily.  -Try zyrtec daily and mucinex twice daily for around 14 days.

## 2021-05-20 ENCOUNTER — Other Ambulatory Visit: Payer: Self-pay | Admitting: Internal Medicine

## 2021-05-20 DIAGNOSIS — E785 Hyperlipidemia, unspecified: Secondary | ICD-10-CM

## 2021-05-24 ENCOUNTER — Encounter: Payer: Self-pay | Admitting: Internal Medicine

## 2021-05-24 ENCOUNTER — Ambulatory Visit (INDEPENDENT_AMBULATORY_CARE_PROVIDER_SITE_OTHER): Payer: 59 | Admitting: Internal Medicine

## 2021-05-24 VITALS — BP 130/90 | HR 75 | Temp 98.2°F | Wt 146.7 lb

## 2021-05-24 DIAGNOSIS — I10 Essential (primary) hypertension: Secondary | ICD-10-CM

## 2021-05-24 DIAGNOSIS — M25551 Pain in right hip: Secondary | ICD-10-CM | POA: Diagnosis not present

## 2021-05-24 MED ORDER — VALSARTAN-HYDROCHLOROTHIAZIDE 160-25 MG PO TABS: 1.0000 | ORAL_TABLET | Freq: Every day | ORAL | 1 refills | Status: DC

## 2021-05-24 NOTE — Progress Notes (Signed)
? ? ? ?Established Patient Office Visit ? ? ? ? ?This visit occurred during the SARS-CoV-2 public health emergency.  Safety protocols were in place, including screening questions prior to the visit, additional usage of staff PPE, and extensive cleaning of exam room while observing appropriate contact time as indicated for disinfecting solutions.  ? ? ?CC/Reason for Visit: Follow-up blood pressure, discuss acute concern ? ?HPI: [name] is a 56 y.o. female who is coming in today for the above mentioned reasons.  At last visit she was taken off lisinopril and placed on losartan due to a constant, chronic cough.  Cough has significantly improved, however blood pressure has remained elevated in the 130/90 range which coincides with in office blood pressure.  She has also been complaining of right hip pain, it hurts on the lateral side of her hip when she touches on it or lays on it at night. ? ?Past Medical/Surgical History: ?Past Medical History:  ?Diagnosis Date  ? Allergy   ? Anxiety   ? Depression   ? GERD (gastroesophageal reflux disease)   ? diet related   ? ? ?Past Surgical History:  ?Procedure Laterality Date  ? WISDOM TOOTH EXTRACTION    ? ? ?Social History: ? reports that she has quit smoking. She has never used smokeless tobacco. She reports current alcohol use of about 14.0 standard drinks per week. She reports that she does not use drugs. ? ?Allergies: ?Allergies  ?Allergen Reactions  ? Lisinopril Cough  ? Sulfa Antibiotics Hives  ? ? ?Family History:  ?Family History  ?Problem Relation Age of Onset  ? Hypertension Mother   ? Stomach cancer Maternal Grandmother   ? CAD Maternal Grandfather   ? Heart attack Maternal Grandfather   ? Breast cancer Maternal Aunt 80  ? Cancer Father   ?     Skin  ? Colon cancer Neg Hx   ? Colon polyps Neg Hx   ? Esophageal cancer Neg Hx   ? Rectal cancer Neg Hx   ? ? ? ?Current Outpatient Medications:  ?  atorvastatin (LIPITOR) 40 MG tablet, TAKE ONE TABLET BY  MOUTH DAILY, Disp: 90 tablet, Rfl: 1 ?  glucosamine-chondroitin 500-400 MG tablet, Take 1 tablet by mouth daily., Disp: , Rfl:  ?  Grape Seed 100 MG CAPS, Take by mouth., Disp: , Rfl:  ?  milk thistle 175 MG tablet, Take 175 mg by mouth daily., Disp: , Rfl:  ?  Misc Natural Products (OSTEO BI-FLEX/5-LOXIN ADVANCED PO), Take by mouth., Disp: , Rfl:  ?  Multiple Vitamin (MULTIVITAMIN) tablet, Take 1 tablet by mouth daily., Disp: , Rfl:  ?  Omega-3 Fatty Acids (FISH OIL) 1000 MG CAPS, Take by mouth., Disp: , Rfl:  ?  Probiotic Product (PROBIOTIC PO), Take by mouth., Disp: , Rfl:  ?  TURMERIC PO, Take by mouth., Disp: , Rfl:  ?  vitamin B-12 (CYANOCOBALAMIN) 500 MCG tablet, Take 500 mcg by mouth daily., Disp: , Rfl:  ?  valsartan-hydrochlorothiazide (DIOVAN-HCT) 160-25 MG tablet, Take 1 tablet by mouth daily., Disp: 90 tablet, Rfl: 1 ? ?Review of Systems:  ?Constitutional: Denies fever, chills, diaphoresis, appetite change and fatigue.  ?HEENT: Denies photophobia, eye pain, redness, hearing loss, ear pain, congestion, sore throat, rhinorrhea, sneezing, mouth sores, trouble swallowing, neck pain, neck stiffness and tinnitus.   ?Respiratory: Denies SOB, DOE, cough, chest tightness,  and wheezing.   ?Cardiovascular: Denies chest pain, palpitations and leg swelling.  ?Gastrointestinal: Denies nausea, vomiting, abdominal pain, diarrhea,  constipation, blood in stool and abdominal distention.  ?Genitourinary: Denies dysuria, urgency, frequency, hematuria, flank pain and difficulty urinating.  ?Endocrine: Denies: hot or cold intolerance, sweats, changes in hair or nails, polyuria, polydipsia. ?Musculoskeletal: Denies myalgias, back pain,  and gait problem.  ?Skin: Denies pallor, rash and wound.  ?Neurological: Denies dizziness, seizures, syncope, weakness, light-headedness, numbness and headaches.  ?Hematological: Denies adenopathy. Easy bruising, personal or family bleeding history  ?Psychiatric/Behavioral: Denies suicidal  ideation, mood changes, confusion, nervousness, sleep disturbance and agitation ? ? ? ?Physical Exam: ?Vitals:  ? 05/24/21 1039  ?BP: 130/90  ?Pulse: 75  ?Temp: 98.2 ?F (36.8 ?C)  ?TempSrc: Oral  ?SpO2: 98%  ?Weight: 146 lb 11.2 oz (66.5 kg)  ? ? ?Body mass index is 23.68 kg/m?. ? ? ?Constitutional: NAD, calm, comfortable ?Eyes: PERRL, lids and conjunctivae normal, wears corrective lenses ?ENMT: Mucous membranes are moist.  ?Respiratory: clear to auscultation bilaterally, no wheezing, no crackles. Normal respiratory effort. No accessory muscle use.  ?Cardiovascular: Regular rate and rhythm, no murmurs / rubs / gallops. No extremity edema.  ?Neurologic: Grossly intact and nonfocal ?Psychiatric: Normal judgment and insight. Alert and oriented x 3. Normal mood.  ? ? ?Impression and Plan: ? ?Primary hypertension  ?- Plan: valsartan-hydrochlorothiazide (DIOVAN-HCT) 160-25 MG tablet ?-BP remains elevated, will discontinue losartan and place on Diovan HCT. ?-6-week follow-up. ? ?Right hip pain  ?- Plan: Ambulatory referral to Orthopedic Surgery ?-Suspect trochanteric bursitis. ? ?Time spent: 30 minutes reviewing chart, interviewing and examining patient and formulating plan of care. ? ? ?Patient Instructions  ?-Nice seeing you today!! ? ?-STOP losartan. ? ?-START diovan HCT 1 tablet daily. ? ?-Schedule follow up in 6-8 weeks. ? ? ? ?Lelon Frohlich, MD ?[facility] ? ? ?

## 2021-05-24 NOTE — Patient Instructions (Signed)
-  Nice seeing you today!! ? ?-STOP losartan. ? ?-START diovan HCT 1 tablet daily. ? ?-Schedule follow up in 6-8 weeks. ?

## 2021-06-01 ENCOUNTER — Other Ambulatory Visit: Payer: Self-pay

## 2021-06-01 ENCOUNTER — Ambulatory Visit: Payer: 59 | Admitting: Orthopaedic Surgery

## 2021-06-01 ENCOUNTER — Encounter: Payer: Self-pay | Admitting: Orthopaedic Surgery

## 2021-06-01 ENCOUNTER — Ambulatory Visit (INDEPENDENT_AMBULATORY_CARE_PROVIDER_SITE_OTHER): Payer: 59

## 2021-06-01 VITALS — Ht 65.0 in | Wt 145.0 lb

## 2021-06-01 DIAGNOSIS — G8929 Other chronic pain: Secondary | ICD-10-CM | POA: Diagnosis not present

## 2021-06-01 DIAGNOSIS — M1611 Unilateral primary osteoarthritis, right hip: Secondary | ICD-10-CM | POA: Diagnosis not present

## 2021-06-01 DIAGNOSIS — M25561 Pain in right knee: Secondary | ICD-10-CM

## 2021-06-01 DIAGNOSIS — M25551 Pain in right hip: Secondary | ICD-10-CM

## 2021-06-01 NOTE — Progress Notes (Signed)
? ?Office Visit Note ?  ?Patient: [name]           ?Date of Birth: 1965/12/03           ?MRN: [id number] ?Visit Date: 06/01/2021 ?             ?Requested by: Isaac Bliss, Rayford Halsted, MD ?[address] ?[place],  [place] [address] ?PCP: Isaac Bliss, Rayford Halsted, MD ? ? ?Assessment & Plan: ?Visit Diagnoses:  ?1. Primary osteoarthritis of right hip   ?2. Chronic pain of right knee   ? ? ?Plan: Impression is advanced right hip degenerative joint disease and referred pain to the right knee.  I do not see any structural problems with the right knee and I think this is related to her hip.  Treatment options were explained and reviewed and their associated pros and cons.  Based on her options we will set her up with a right hip injection with Dr. Ernestina Patches.  We also provided her with information on hip replacement surgery today.  Questions encouraged and answered.  She will follow-up as needed. ? ?Follow-Up Instructions: No follow-ups on file.  ? ?Orders:  ?Orders Placed This Encounter  ?Procedures  ? XR HIP UNILAT W OR W/O PELVIS 2-3 VIEWS RIGHT  ? XR KNEE 3 VIEW RIGHT  ? ?No orders of the defined types were placed in this encounter. ? ? ? ? Procedures: ?No procedures performed ? ? ?Clinical Data: ?No additional findings. ? ? ?Subjective: ?Chief Complaint  ?Patient presents with  ? Right Hip - Pain  ? Right Knee - Pain  ? ? ?HPI ? ?Patient is a very pleasant 56 year old female here for evaluation of right hip and knee pain for about 3 months without any injuries or trauma.  She endorses deep hip and upper thigh pain with constant pain with occasional breakthrough severe pain.  She has trouble finding a comfortable position to sleep at night.  She is limping and has trouble walking.  She has start up pain and stiffness.  Works as an Marketing executive at [employer]  Ibuprofen helps temporarily.  Denies any mechanical symptoms in the knee. ? ?Review of Systems  ?Constitutional: Negative.    ?HENT: Negative.    ?Eyes: Negative.   ?Respiratory: Negative.    ?Cardiovascular: Negative.   ?Endocrine: Negative.   ?Musculoskeletal: Negative.   ?Neurological: Negative.   ?Hematological: Negative.   ?Psychiatric/Behavioral: Negative.    ?All other systems reviewed and are negative. ? ? ?Objective: ?Vital Signs: Ht '5\' 5"'$  (1.651 m)   Wt 145 lb (65.8 kg)   LMP  (LMP Unknown)   BMI 24.13 kg/m?  ? ?Physical Exam ?Vitals and nursing note reviewed.  ?Constitutional:   ?   Appearance: She is well-developed.  ?HENT:  ?   Head: Normocephalic and atraumatic.  ?Pulmonary:  ?   Effort: Pulmonary effort is normal.  ?Abdominal:  ?   Palpations: Abdomen is soft.  ?Musculoskeletal:  ?   Cervical back: Neck supple.  ?Skin: ?   General: Skin is warm.  ?   Capillary Refill: Capillary refill takes less than 2 seconds.  ?Neurological:  ?   Mental Status: She is alert and oriented to person, place, and time.  ?Psychiatric:     ?   Behavior: Behavior normal.     ?   Thought Content: Thought content normal.     ?   Judgment: Judgment normal.  ? ? ?Ortho Exam ? ?Examination of the  right hip shows pain with attempted range of motion.  She has moderately restricted range of motion secondary to pain.  Pain with straight leg raise and Stinchfield sign.  Pain with internal rotation past neutral. ? ?Examination of the right knee is unremarkable. ? ?Specialty Comments:  ?No specialty comments available. ? ?Imaging: ?XR HIP UNILAT W OR W/O PELVIS 2-3 VIEWS RIGHT ? ?Result Date: 06/01/2021 ?Advanced degenerative joint disease of the right hip with bone-on-bone joint space narrowing and large superior acetabular and femoral head osteophytes. ? ?XR KNEE 3 VIEW RIGHT ? ?Result Date: 06/01/2021 ?No acute or structural abnormalities  ? ? ?PMFS History: ?Patient Active Problem List  ? Diagnosis Date Noted  ? Primary osteoarthritis of right hip 06/01/2021  ? Primary hypertension 07/19/2020  ? Hyperlipidemia 02/23/2020  ? Abnormal TSH 02/23/2020  ?  Vitamin D deficiency 02/23/2020  ? ?Past Medical History:  ?Diagnosis Date  ? Allergy   ? Anxiety   ? Depression   ? GERD (gastroesophageal reflux disease)   ? diet related   ?  ?Family History  ?Problem Relation Age of Onset  ? Hypertension Mother   ? Stomach cancer Maternal Grandmother   ? CAD Maternal Grandfather   ? Heart attack Maternal Grandfather   ? Breast cancer Maternal Aunt 80  ? Cancer Father   ?     Skin  ? Colon cancer Neg Hx   ? Colon polyps Neg Hx   ? Esophageal cancer Neg Hx   ? Rectal cancer Neg Hx   ?  ?Past Surgical History:  ?Procedure Laterality Date  ? WISDOM TOOTH EXTRACTION    ? ?Social History  ? ?Occupational History  ? Not on file  ?Tobacco Use  ? Smoking status: Former  ? Smokeless tobacco: Never  ?Vaping Use  ? Vaping Use: Never used  ?Substance and Sexual Activity  ? Alcohol use: Yes  ?  Alcohol/week: 14.0 standard drinks  ?  Types: 7 Glasses of wine, 7 Cans of beer per week  ? Drug use: No  ? Sexual activity: Yes  ?  Comment: 1st intercourse 29 yo-5 partners  ? ? ? ? ? ? ?

## 2021-06-22 ENCOUNTER — Ambulatory Visit: Payer: 59 | Admitting: Physical Medicine and Rehabilitation

## 2021-06-22 ENCOUNTER — Encounter: Payer: Self-pay | Admitting: Physical Medicine and Rehabilitation

## 2021-06-22 ENCOUNTER — Ambulatory Visit: Payer: Self-pay

## 2021-06-22 DIAGNOSIS — M25551 Pain in right hip: Secondary | ICD-10-CM

## 2021-06-22 NOTE — Progress Notes (Signed)
? ?  [name] - 56 y.o. female MRN [id number]  Date of birth: [birth year] ? ?Office Visit Note: ?Visit Date: 06/22/2021 ?PCP: Isaac Bliss, Rayford Halsted, MD ?Referred by: Isaac Bliss, Est[place]* ? ?Subjective: ?Chief Complaint  ?Patient presents with  ? Right Hip - Pain  ? ?HPI:  [name] is a 56 y.o. female who comes in today at the request of Dr. Eduard Roux for planned Right anesthetic hip arthrogram with fluoroscopic guidance.  The patient has failed conservative care including home exercise, medications, time and activity modification.  This injection will be diagnostic and hopefully therapeutic.  Please see requesting physician notes for further details and justification. ? ?ROS Otherwise per HPI. ? ?Assessment & Plan: ?Visit Diagnoses:  ?  ICD-10-CM   ?1. Pain in right hip  M25.551 XR C-ARM NO REPORT  ?  Large Joint Inj: R hip joint  ?  ?  ?Plan: No additional findings.  ? ?Meds & Orders: No orders of the defined types were placed in this encounter. ?  ?Orders Placed This Encounter  ?Procedures  ? Large Joint Inj: R hip joint  ? XR C-ARM NO REPORT  ?  ?Follow-up: Return for visit to requesting provider as needed.  ? ?Procedures: ?Large Joint Inj: R hip joint on 06/22/2021 1:42 PM ?Indications: diagnostic evaluation and pain ?Details: 22 G 3.5 in needle, fluoroscopy-guided anterior approach ? ?Arthrogram: No ? ?Medications: 4 mL bupivacaine 0.25 %; 60 mg triamcinolone acetonide 40 MG/ML ?Outcome: tolerated well, no immediate complications ? ?There was excellent flow of contrast producing a partial arthrogram of the hip. The patient did have relief of symptoms during the anesthetic phase of the injection. ?Procedure, treatment alternatives, risks and benefits explained, specific risks discussed. Consent was given by the patient. Immediately prior to procedure a time out was called to verify the correct patient, procedure, equipment, support staff and site/side marked as required. Patient was  prepped and draped in the usual sterile fashion.  ? ?  ?   ? ?Clinical History: ?No specialty comments available.  ? ? ? ?Objective:  VS:  HT:    WT:   BMI:     BP:   HR: bpm  TEMP: ( )  RESP:  ?Physical Exam  ? ?Imaging: ?No results found. ?

## 2021-06-22 NOTE — Progress Notes (Signed)
Pt state right hip pain. Pt state sitting makes the pain worse. Pt state she takes over the counter pain meds to help ease her pain. ? ?Numeric Pain Rating Scale and Functional Assessment ?Average Pain 3 ? ? ?In the last MONTH (on 0-10 scale) has pain interfered with the following? ? ?1. General activity like being  able to carry out your everyday physical activities such as walking, climbing stairs, carrying groceries, or moving a chair?  ?Rating(8) ? ? ?+Driver, -BT, -Dye Allergies. ? ?

## 2021-07-11 MED ORDER — BUPIVACAINE HCL 0.25 % IJ SOLN
4.0000 mL | INTRAMUSCULAR | Status: AC | PRN
Start: 2021-06-22 — End: 2021-06-22
  Administered 2021-06-22: 4 mL via INTRA_ARTICULAR

## 2021-07-11 MED ORDER — TRIAMCINOLONE ACETONIDE 40 MG/ML IJ SUSP
60.0000 mg | INTRAMUSCULAR | Status: AC | PRN
Start: 2021-06-22 — End: 2021-06-22
  Administered 2021-06-22: 60 mg via INTRA_ARTICULAR

## 2021-08-16 ENCOUNTER — Encounter: Payer: Self-pay | Admitting: Internal Medicine

## 2021-08-16 ENCOUNTER — Ambulatory Visit (INDEPENDENT_AMBULATORY_CARE_PROVIDER_SITE_OTHER): Payer: 59 | Admitting: Internal Medicine

## 2021-08-16 DIAGNOSIS — E785 Hyperlipidemia, unspecified: Secondary | ICD-10-CM

## 2021-08-16 DIAGNOSIS — I10 Essential (primary) hypertension: Secondary | ICD-10-CM | POA: Diagnosis not present

## 2021-08-16 MED ORDER — VALSARTAN-HYDROCHLOROTHIAZIDE 160-25 MG PO TABS: 1.0000 | ORAL_TABLET | Freq: Every day | ORAL | 1 refills | Status: DC

## 2021-08-16 MED ORDER — ATORVASTATIN CALCIUM 40 MG PO TABS: 40.0000 mg | ORAL_TABLET | Freq: Every day | ORAL | 1 refills | Status: DC

## 2021-08-16 NOTE — Progress Notes (Signed)
Established Patient Office Visit     CC/Reason for Visit: Discuss blood pressure, medication refills  HPI: [name] is a 56 y.o. female who is coming in today for the above mentioned reasons.  At last visit due to uncontrolled hypertension she was taken off losartan and placed on Diovan HCT.  She is tolerating it well.  Her blood pressure in office today is 94/60.  She has not been checking routinely at home.  She has described a couple episodes of lightheadedness when bending down and standing up all of a sudden.  Past Medical/Surgical History: Past Medical History:  Diagnosis Date   Allergy    Anxiety    Depression    GERD (gastroesophageal reflux disease)    diet related     Past Surgical History:  Procedure Laterality Date   WISDOM TOOTH EXTRACTION      Social History:  reports that she has quit smoking. She has never used smokeless tobacco. She reports current alcohol use of about 14.0 standard drinks per week. She reports that she does not use drugs.  Allergies: Allergies  Allergen Reactions   Lisinopril Cough   Sulfa Antibiotics Hives    Family History:  Family History  Problem Relation Age of Onset   Hypertension Mother    Stomach cancer Maternal Grandmother    CAD Maternal Grandfather    Heart attack Maternal Grandfather    Breast cancer Maternal Aunt 66   Cancer Father        Skin   Colon cancer Neg Hx    Colon polyps Neg Hx    Esophageal cancer Neg Hx    Rectal cancer Neg Hx      Current Outpatient Medications:    glucosamine-chondroitin 500-400 MG tablet, Take 1 tablet by mouth daily., Disp: , Rfl:    Grape Seed 100 MG CAPS, Take by mouth., Disp: , Rfl:    milk thistle 175 MG tablet, Take 175 mg by mouth daily., Disp: , Rfl:    Misc Natural Products (OSTEO BI-FLEX/5-LOXIN ADVANCED PO), Take by mouth., Disp: , Rfl:    Multiple Vitamin (MULTIVITAMIN) tablet, Take 1 tablet by mouth daily., Disp: , Rfl:    Multiple  Vitamins-Minerals (OCUVITE ADULT 50+ PO), Take by mouth., Disp: , Rfl:    Omega-3 Fatty Acids (FISH OIL) 1000 MG CAPS, Take by mouth., Disp: , Rfl:    Probiotic Product (PROBIOTIC PO), Take by mouth., Disp: , Rfl:    PSYLLIUM FIBER PO, Take by mouth., Disp: , Rfl:    TURMERIC PO, Take by mouth., Disp: , Rfl:    vitamin B-12 (CYANOCOBALAMIN) 500 MCG tablet, Take 500 mcg by mouth daily., Disp: , Rfl:    atorvastatin (LIPITOR) 40 MG tablet, Take 1 tablet (40 mg total) by mouth daily., Disp: 90 tablet, Rfl: 1   valsartan-hydrochlorothiazide (DIOVAN-HCT) 160-25 MG tablet, Take 1 tablet by mouth daily., Disp: 90 tablet, Rfl: 1  Review of Systems:  Constitutional: Denies fever, chills, diaphoresis, appetite change and fatigue.  HEENT: Denies photophobia, eye pain, redness, hearing loss, ear pain, congestion, sore throat, rhinorrhea, sneezing, mouth sores, trouble swallowing, neck pain, neck stiffness and tinnitus.   Respiratory: Denies SOB, DOE, cough, chest tightness,  and wheezing.   Cardiovascular: Denies chest pain, palpitations and leg swelling.  Gastrointestinal: Denies nausea, vomiting, abdominal pain, diarrhea, constipation, blood in stool and abdominal distention.  Genitourinary: Denies dysuria, urgency, frequency, hematuria, flank pain and difficulty urinating.  Endocrine: Denies: hot or cold intolerance, sweats,  changes in hair or nails, polyuria, polydipsia. Musculoskeletal: Denies myalgias, back pain, joint swelling, arthralgias and gait problem.  Skin: Denies pallor, rash and wound.  Neurological: Denies  seizures, syncope, weakness,  numbness and headaches.  Hematological: Denies adenopathy. Easy bruising, personal or family bleeding history  Psychiatric/Behavioral: Denies suicidal ideation, mood changes, confusion, nervousness, sleep disturbance and agitation    Physical Exam: Vitals:   08/16/21 1046  BP: 94/60  Pulse: 74  Temp: 98.3 F (36.8 C)  TempSrc: Oral  SpO2: 98%   Weight: 135 lb 3.2 oz (61.3 kg)  Height: '5\' 5"'$  (1.651 m)    Body mass index is 22.5 kg/m.   Constitutional: NAD, calm, comfortable Eyes: PERRL, lids and conjunctivae normal, wears corrective lenses ENMT: Mucous membranes are moist.  Respiratory: clear to auscultation bilaterally, no wheezing, no crackles. Normal respiratory effort. No accessory muscle use.  Cardiovascular: Regular rate and rhythm, no murmurs / rubs / gallops. No extremity edema. Psychiatric: Normal judgment and insight. Alert and oriented x 3. Normal mood.    Impression and Plan:  Hyperlipidemia, unspecified hyperlipidemia type  - Plan: atorvastatin (LIPITOR) 40 MG tablet  Primary hypertension  - Plan: valsartan-hydrochlorothiazide (DIOVAN-HCT) 160-25 MG tablet -I am concerned that with low normal blood pressures that her few episodes of lightheadedness might indeed be orthostasis.  She will go ahead and check blood pressures at home and notify me via [place] after she has accumulated 2 to 3 weeks worth of data to discuss.  We could potentially have to dose her Diovan HCT.    Time spent:21 minutes reviewing chart, interviewing and examining patient and formulating plan of care.    Lelon Frohlich, MD [facility]

## 2021-08-24 ENCOUNTER — Ambulatory Visit (INDEPENDENT_AMBULATORY_CARE_PROVIDER_SITE_OTHER): Payer: 59 | Admitting: Adult Health

## 2021-08-24 ENCOUNTER — Encounter: Payer: Self-pay | Admitting: Adult Health

## 2021-08-24 VITALS — BP 100/64 | HR 66 | Temp 98.1°F | Wt 136.0 lb

## 2021-08-24 DIAGNOSIS — T7840XA Allergy, unspecified, initial encounter: Secondary | ICD-10-CM | POA: Diagnosis not present

## 2021-08-24 DIAGNOSIS — L237 Allergic contact dermatitis due to plants, except food: Secondary | ICD-10-CM

## 2021-08-24 MED ORDER — PREDNISONE 10 MG PO TABS: ORAL_TABLET | ORAL | 0 refills | Status: DC

## 2021-08-24 MED ORDER — METHYLPREDNISOLONE ACETATE 80 MG/ML IJ SUSP
80.0000 mg | Freq: Once | INTRAMUSCULAR | Status: AC
  Administered 2021-08-24: 80 mg via INTRAMUSCULAR

## 2021-08-24 NOTE — Progress Notes (Signed)
Subjective:    Patient ID: [name], female    DOB: June 17, 1965, 56 y.o.   MRN: [id number]  HPI 56 year old female who  has a past medical history of Allergy, Anxiety, Depression, and GERD (gastroesophageal reflux disease).  She is a patient of Dr. Jerilee Hoh who I am seeing today for an acute issue  She noticed Saturday she had a red vesicular rash on her right forearm.  This rash is itchy and seems to be spreading.  She has been using anti-itch cream but this only last for a few minutes and the itching begins again.  Has not noticed any drainage.  Additionally, she woke up yesterday and area around her left eye was swollen and appeared puffy.  Her eye at lid was slightly discolored and she noticed a "sticky sensation in the corner of her eye.  This morning she feels as though the symptoms are beginning on the right eye as well.  She does endorse some mild blurred vision.  Currently with breathing or swallowing.   Review of Systems See HPI   Past Medical History:  Diagnosis Date   Allergy    Anxiety    Depression    GERD (gastroesophageal reflux disease)    diet related     Social History   Socioeconomic History   Marital status: Married    Spouse name: Not on file   Number of children: Not on file   Years of education: Not on file   Highest education level: Not on file  Occupational History   Not on file  Tobacco Use   Smoking status: Former   Smokeless tobacco: Never  Vaping Use   Vaping Use: Never used  Substance and Sexual Activity   Alcohol use: Yes    Alcohol/week: 14.0 standard drinks of alcohol    Types: 7 Glasses of wine, 7 Cans of beer per week   Drug use: No   Sexual activity: Yes    Comment: 1st intercourse 28 yo-5 partners  Other Topics Concern   Not on file  Social History Narrative   Not on file   Social Determinants of Health   Financial Resource Strain: Not on file  Food Insecurity: Not on file  Transportation Needs: Not on file   Physical Activity: Not on file  Stress: Not on file  Social Connections: Not on file  Intimate Partner Violence: Not on file    Past Surgical History:  Procedure Laterality Date   WISDOM TOOTH EXTRACTION      Family History  Problem Relation Age of Onset   Hypertension Mother    Stomach cancer Maternal Grandmother    CAD Maternal Grandfather    Heart attack Maternal Grandfather    Breast cancer Maternal Aunt 63   Cancer Father        Skin   Colon cancer Neg Hx    Colon polyps Neg Hx    Esophageal cancer Neg Hx    Rectal cancer Neg Hx     Allergies  Allergen Reactions   Lisinopril Cough   Sulfa Antibiotics Hives    Current Outpatient Medications on File Prior to Visit  Medication Sig Dispense Refill   atorvastatin (LIPITOR) 40 MG tablet Take 1 tablet (40 mg total) by mouth daily. 90 tablet 1   glucosamine-chondroitin 500-400 MG tablet Take 1 tablet by mouth daily.     Grape Seed 100 MG CAPS Take by mouth.     milk thistle 175 MG tablet Take  175 mg by mouth daily.     Misc Natural Products (OSTEO BI-FLEX/5-LOXIN ADVANCED PO) Take by mouth.     Multiple Vitamin (MULTIVITAMIN) tablet Take 1 tablet by mouth daily.     Multiple Vitamins-Minerals (OCUVITE ADULT 50+ PO) Take by mouth.     Omega-3 Fatty Acids (FISH OIL) 1000 MG CAPS Take by mouth.     Probiotic Product (PROBIOTIC PO) Take by mouth.     PSYLLIUM FIBER PO Take by mouth.     TURMERIC PO Take by mouth.     valsartan-hydrochlorothiazide (DIOVAN-HCT) 160-25 MG tablet Take 1 tablet by mouth daily. 90 tablet 1   vitamin B-12 (CYANOCOBALAMIN) 500 MCG tablet Take 500 mcg by mouth daily.     No current facility-administered medications on file prior to visit.    LMP  (LMP Unknown)       Objective:   Physical Exam Vitals and nursing note reviewed.  Constitutional:      Appearance: Normal appearance.  Eyes:     General: Lids are normal. Lids are everted, no foreign bodies appreciated.     Extraocular  Movements: Extraocular movements intact.     Conjunctiva/sclera: Conjunctivae normal.     Comments: She does have some orbital edema.  No signs of pinkeye.  Skin:    General: Skin is warm and dry.     Capillary Refill: Capillary refill takes less than 2 seconds.     Findings: Rash present. Rash is vesicular.     Comments: Has a red vesicular rash that encompasses her right wrist.  Neurological:     General: No focal deficit present.     Mental Status: She is alert and oriented to person, place, and time.  Psychiatric:        Mood and Affect: Mood normal.        Behavior: Behavior normal.        Thought Content: Thought content normal.        Judgment: Judgment normal.       Assessment & Plan:  1. Poison ivy dermatitis  - methylPREDNISolone acetate (DEPO-MEDROL) injection 80 mg - predniSONE (DELTASONE) 10 MG tablet; 40 mg x 3 days, 20 mg x 3 days, 10 mg x 3 days  Dispense: 21 tablet; Refill: 0  2. Allergic reaction, initial encounter -Possibly due to allergic reaction from poison ivy or other environmental trigger.  We will give Depo-Medrol injection in the office today and cover her with a steroid taper that were not prescribed for her poison ivy dermatitis.  Advise follow-up if symptoms continue or get worse over the next 24 hours. - methylPREDNISolone acetate (DEPO-MEDROL) injection 80 mg  Dorothyann Peng, NP

## 2021-09-11 ENCOUNTER — Telehealth: Payer: Self-pay | Admitting: Internal Medicine

## 2021-09-11 ENCOUNTER — Encounter: Payer: Self-pay | Admitting: Internal Medicine

## 2021-09-12 NOTE — Telephone Encounter (Signed)
Patient is aware.  Appointment offered, but the patient declined.

## 2021-12-08 IMAGING — MG MM DIGITAL SCREENING BILAT W/ TOMO AND CAD
8 series · 8 of 24 positions shown · non-contrast
Comparison: Previous exam(s).

CLINICAL DATA: Screening.

EXAM:
DIGITAL SCREENING BILATERAL MAMMOGRAM WITH TOMOSYNTHESIS AND CAD
TECHNIQUE: Bilateral screening digital craniocaudal and mediolateral oblique
mammograms were obtained. Bilateral screening digital breast
tomosynthesis was performed. The images were evaluated with
computer-aided detection.

[L MLO synth-2D]
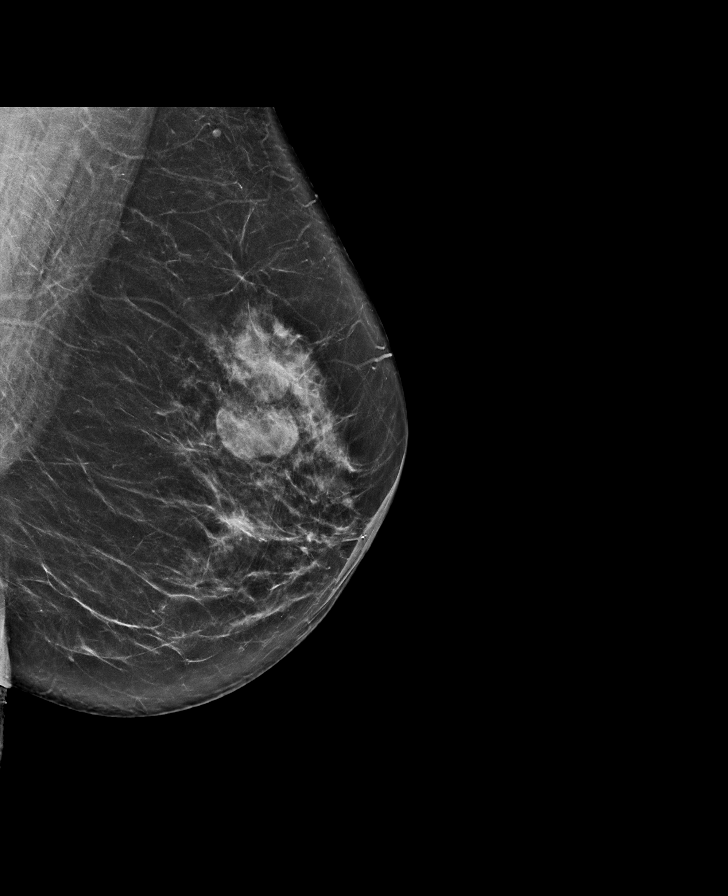

[R MLO synth-2D]
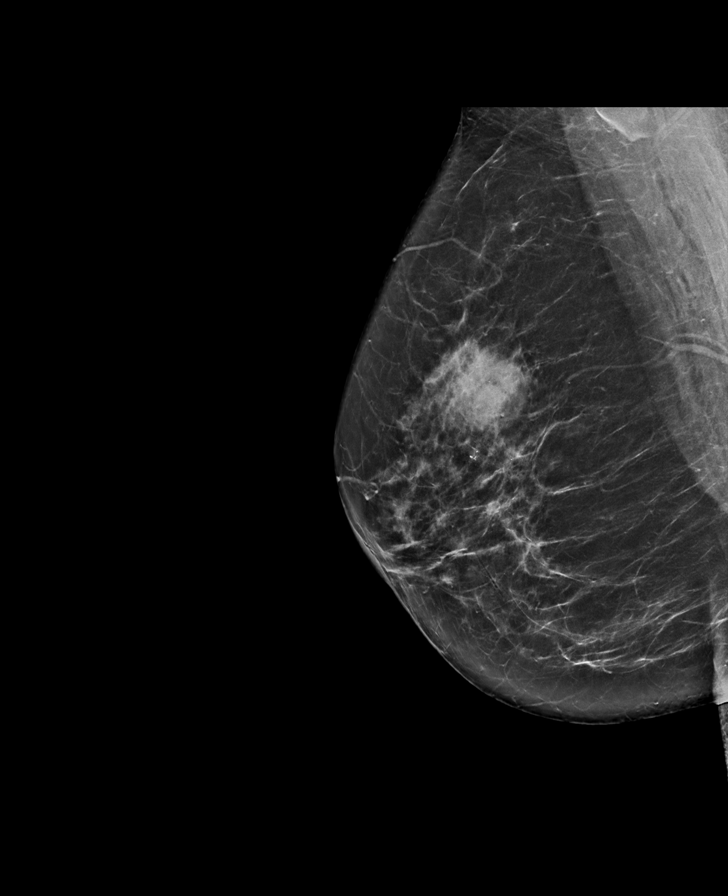

[L CC synth-2D]
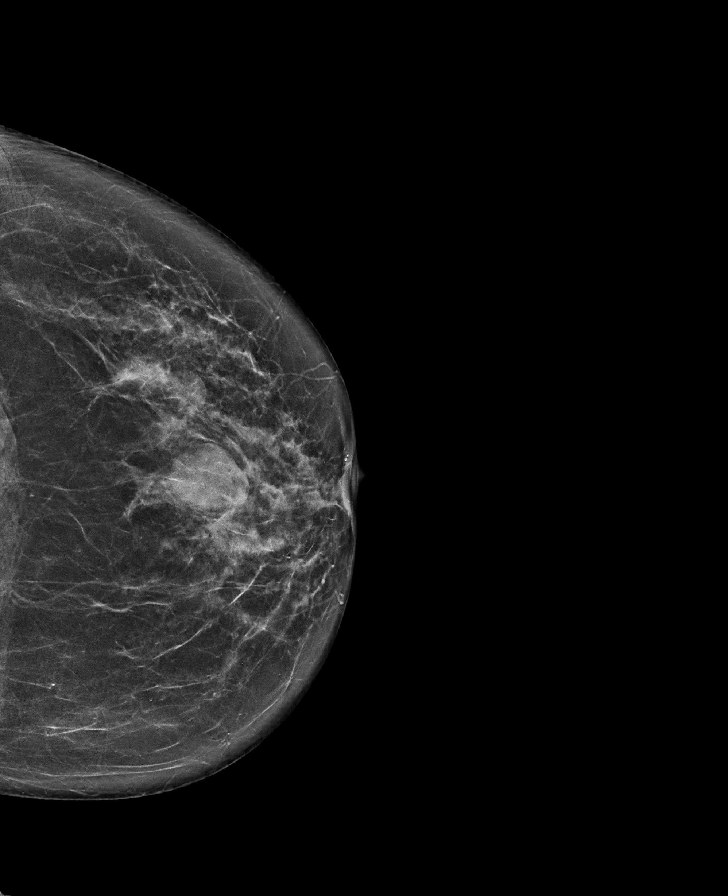

[R CC synth-2D]
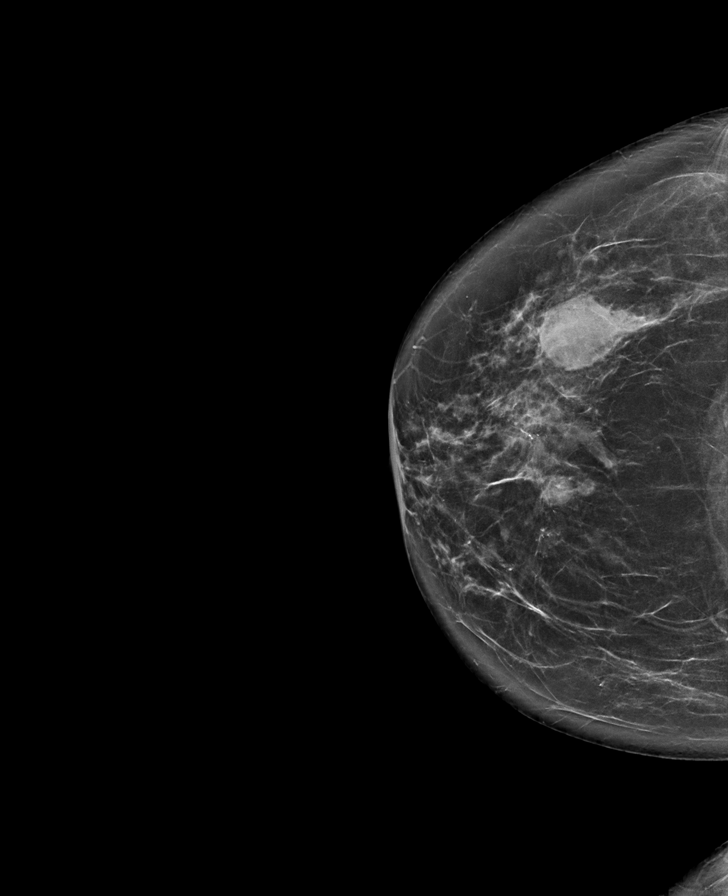

[L CC tomo · tomo slice 36/71.0]
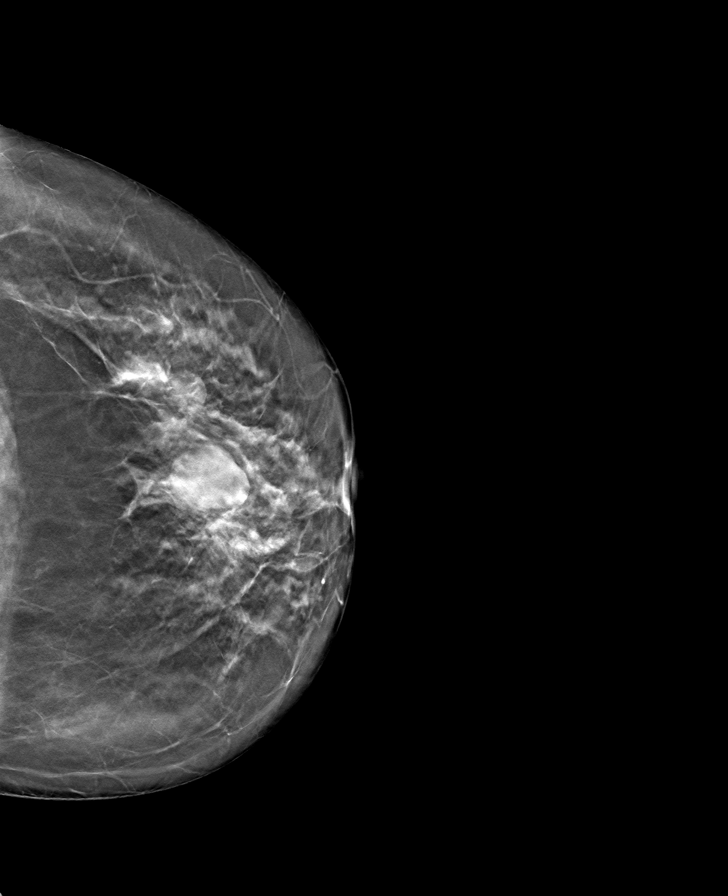

[L MLO tomo · tomo slice 41/82.0]
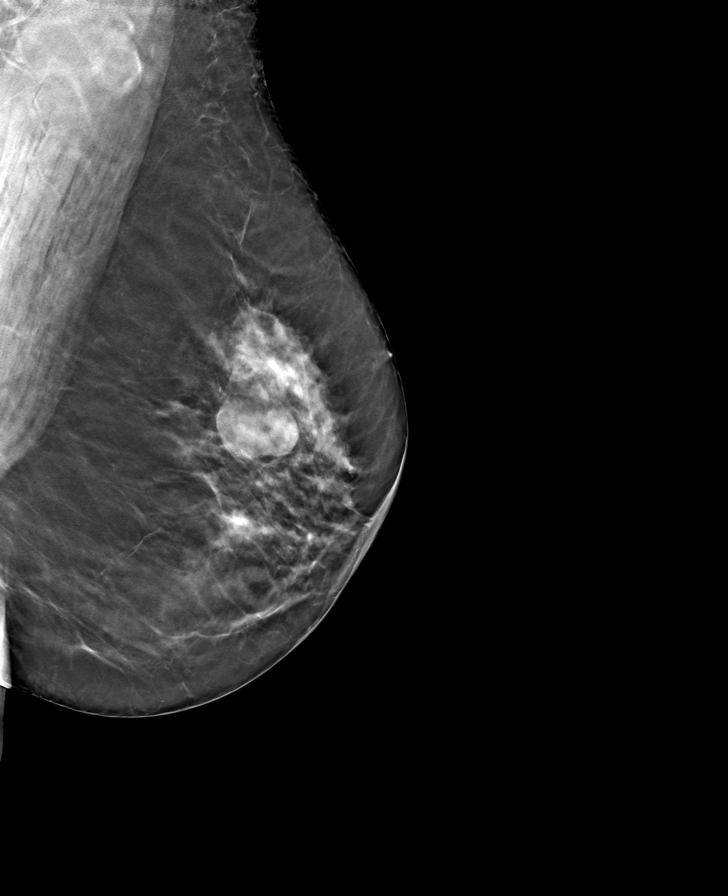

[R MLO tomo · tomo slice 40/79.0]
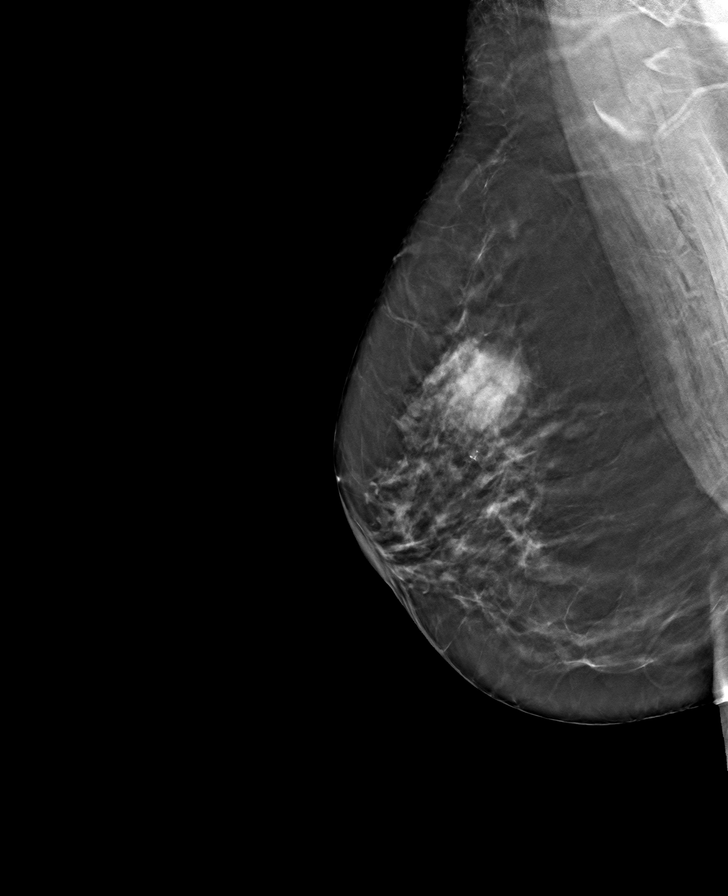

[R CC tomo · tomo slice 37/72.0]
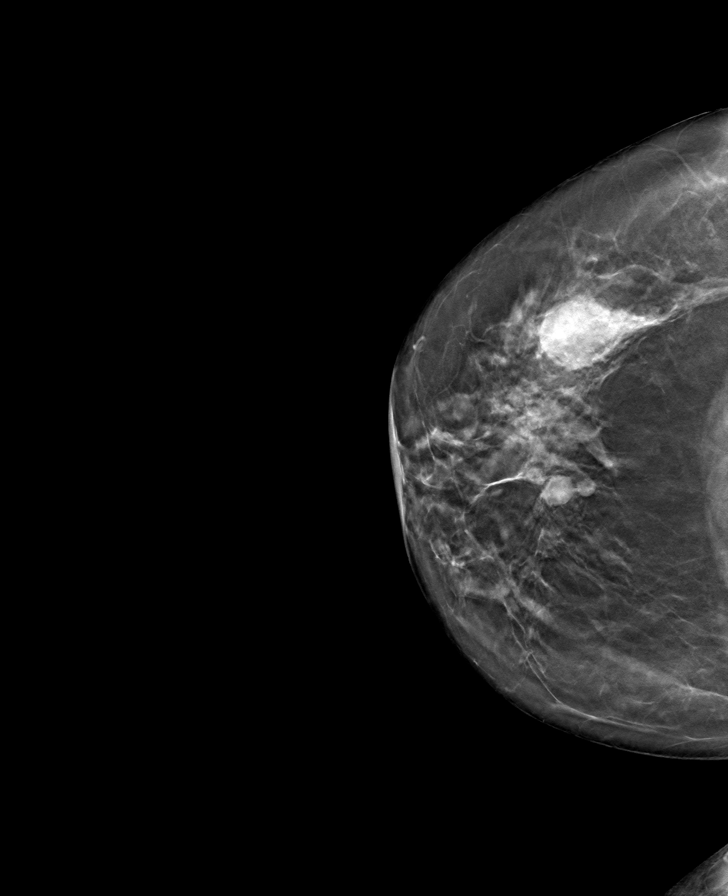

[8 of 24 positions shown; findings below may reference images not displayed]

ACR Breast Density Category c: The breast tissue is heterogeneously
dense, which may obscure small masses.
FINDINGS: There are no findings suspicious for malignancy.
IMPRESSION: No mammographic evidence of malignancy. A result letter of this
screening mammogram will be mailed directly to the patient.

RECOMMENDATION:
Screening mammogram in one year. (Code:Q3-W-BC3)

BI-RADS CATEGORY  1: Negative.

## 2021-12-17 DIAGNOSIS — Z419 Encounter for procedure for purposes other than remedying health state, unspecified: Secondary | ICD-10-CM | POA: Diagnosis not present

## 2022-01-17 DIAGNOSIS — Z419 Encounter for procedure for purposes other than remedying health state, unspecified: Secondary | ICD-10-CM | POA: Diagnosis not present

## 2022-02-05 ENCOUNTER — Other Ambulatory Visit: Payer: Self-pay | Admitting: Internal Medicine

## 2022-02-05 DIAGNOSIS — Z1231 Encounter for screening mammogram for malignant neoplasm of breast: Secondary | ICD-10-CM

## 2022-02-16 DIAGNOSIS — Z419 Encounter for procedure for purposes other than remedying health state, unspecified: Secondary | ICD-10-CM | POA: Diagnosis not present

## 2022-02-19 ENCOUNTER — Encounter: Payer: Self-pay | Admitting: Nurse Practitioner

## 2022-02-20 ENCOUNTER — Ambulatory Visit: Payer: Commercial Managed Care - HMO | Admitting: Surgery

## 2022-02-22 ENCOUNTER — Encounter: Payer: Self-pay | Admitting: Nurse Practitioner

## 2022-02-22 ENCOUNTER — Ambulatory Visit (INDEPENDENT_AMBULATORY_CARE_PROVIDER_SITE_OTHER): Payer: Medicaid Other | Admitting: Nurse Practitioner

## 2022-02-22 VITALS — BP 84/60 | HR 74 | Ht 65.5 in | Wt 138.0 lb

## 2022-02-22 DIAGNOSIS — Z780 Asymptomatic menopausal state: Secondary | ICD-10-CM | POA: Diagnosis not present

## 2022-02-22 DIAGNOSIS — Z01419 Encounter for gynecological examination (general) (routine) without abnormal findings: Secondary | ICD-10-CM

## 2022-02-22 DIAGNOSIS — R031 Nonspecific low blood-pressure reading: Secondary | ICD-10-CM | POA: Diagnosis not present

## 2022-02-22 NOTE — Progress Notes (Signed)
   [name] May 09, 1965 [id number]   History:  56 y.o. G4P4 presents for annual exam without GYN complaints. Postmenopausal - no HRT. Normal pap history. Sees psychiatry for depression and anxiety. HTN managed by PCP. Down 45 pounds with diet and exercise.   Gynecologic History No LMP recorded (lmp unknown). Patient is postmenopausal.   Contraception: post menopausal status Sexually active: Yes  Health maintenance Last Pap: 10/05/2019. Results were: Normal neg HPV, 5-year repeat Last mammogram: 10/25/2020. Results were: Normal. Scheduled 03/29/22 Last colonoscopy: 12/21/2019, 7-year recall Last Dexa: Not indicated  Past medical history, past surgical history, family history and social history were all reviewed and documented in the EPIC chart. Married. 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 4 children ages 55, 35, 53, and 85.   ROS:  A ROS was performed and pertinent positives and negatives are included.  Exam:  Vitals:   02/22/22 1434  BP: (!) 84/60  Pulse: 74  SpO2: 98%  Weight: 138 lb (62.6 kg)  Height: 5' 5.5" (1.664 m)    Body mass index is 22.62 kg/m.  General appearance:  Normal Thyroid:  Symmetrical, normal in size, without palpable masses or nodularity. Respiratory  Auscultation:  Clear without wheezing or rhonchi Cardiovascular  Auscultation:  Regular rate, without rubs, murmurs or gallops  Edema/varicosities:  Not grossly evident Abdominal  Soft,nontender, without masses, guarding or rebound.  Liver/spleen:  No organomegaly noted  Hernia:  None appreciated  Skin  Inspection:  Grossly normal Breasts: Examined lying and sitting.   Right: Without masses, retractions, discharge or axillary adenopathy.   Left: Without masses, retractions, discharge or axillary adenopathy. Genitourinary   Inguinal/mons:  Normal without inguinal adenopathy  External genitalia:  Normal appearing vulva with no masses, tenderness, or lesions  BUS/Urethra/Skene's glands:   Normal  Vagina:  Normal appearing with normal color and discharge, no lesions  Cervix:  Normal appearing without discharge or lesions  Uterus:  Normal in size, shape and contour.  Midline and mobile, nontender  Adnexa/parametria:     Rt: Normal in size, without masses or tenderness.   Lt: Normal in size, without masses or tenderness.  Anus and perineum: Normal  Digital rectal exam: Normal sphincter tone without palpated masses or tenderness  Patient informed chaperone available to be present for breast and pelvic exam. Patient has requested no chaperone to be present. Patient has been advised what will be completed during breast and pelvic exam.   Assessment/Plan:  56 y.o. G4P4 for annual exam.   Well female exam with routine gynecological exam - Education provided on SBEs, importance of preventative screenings, current guidelines, high calcium diet, regular exercise, and multivitamin daily. Labs with PCP.   Postmenopausal - No HRT, no bleeding.   Low blood pressure reading - BP 84/60. Reports sometimes she does feel dizzy. Recommend stopping blood pressure meds and keeping BP log. Will do this and discuss with PCP. Down 45 pounds.   Screening for cervical cancer - Normal Pap history.  Will repeat at 5-year interval per guidelines.  Screening for breast cancer - Normal mammogram history.  Continue annual screenings.  Normal breast exam today.  Screening for colon cancer - 2021 colonoscopy. Will repeat at GI's recommended interval.   Screening for osteoporosis - average risk. Will begin screenings at age 56.   Follow up in 1 year for annual.      [name] [facility], 2:57 PM 02/22/2022

## 2022-02-23 ENCOUNTER — Telehealth: Payer: Self-pay

## 2022-02-23 NOTE — Telephone Encounter (Signed)
--  Caller states was at another doctor appointment and her blood pressure was low and needs appointment. Blood pressure 80/60. States gets dizzy at times and feels weak, difficult to concentrate. States she did not realize her symptoms were due to low blood pressure, Had HTN and was on medication  02/22/2022 4:28:54 PM Go to ED Now Patsey Berthold, RN, Lattie Haw  Referrals GO TO FACILITY UNDECIDED  02/23/22 1022 - Pt states she has felt intermittently dizzy for months. Last took medication 02/21/22 evening. Instructed to take BP daily & hold medication if systolic 579 or below or if pt feels dizzy at any point during the day. Pt verb understanding. Appt scheduled at pt earliest convenience on 03/05/22.

## 2022-03-05 ENCOUNTER — Ambulatory Visit (INDEPENDENT_AMBULATORY_CARE_PROVIDER_SITE_OTHER): Payer: Medicaid Other | Admitting: Internal Medicine

## 2022-03-05 ENCOUNTER — Ambulatory Visit: Payer: Commercial Managed Care - HMO | Admitting: Surgery

## 2022-03-05 VITALS — BP 130/74 | HR 76 | Temp 98.7°F | Wt 141.0 lb

## 2022-03-05 DIAGNOSIS — I951 Orthostatic hypotension: Secondary | ICD-10-CM

## 2022-03-05 DIAGNOSIS — E782 Mixed hyperlipidemia: Secondary | ICD-10-CM | POA: Diagnosis not present

## 2022-03-05 DIAGNOSIS — I10 Essential (primary) hypertension: Secondary | ICD-10-CM | POA: Diagnosis not present

## 2022-03-05 DIAGNOSIS — M1611 Unilateral primary osteoarthritis, right hip: Secondary | ICD-10-CM

## 2022-03-05 MED ORDER — CYCLOBENZAPRINE HCL 10 MG PO TABS: 10.0000 mg | ORAL_TABLET | Freq: Three times a day (TID) | ORAL | 0 refills | Status: DC | PRN

## 2022-03-05 NOTE — Progress Notes (Signed)
Established Patient Office Visit     CC/Reason for Visit: Follow-up low blood pressure  HPI: [name] is a 56 y.o. female who is coming in today for the above mentioned reasons. Past Medical History is significant for: Hypertension.  She is on Diovan HCT.  On December 7 she visited her GYN and was noted to have a blood pressure of 84/60 in addition to lightheadedness and fatigue.  She was asked to follow-up with me and discontinue her Diovan HCT which she has.  Blood pressure in office today is 130/74.  Blood pressures at home have ranged between systolics of 703-500X and diastolics usually in the 38H to 70s.  She is feeling improved.  In addition she has been diagnosed with right hip osteoarthritis and has a presurgical consultation scheduled for January with orthopedics.  She is wondering if I can prescribe a muscle relaxer.   Past Medical/Surgical History: Past Medical History:  Diagnosis Date   Allergy    Anxiety    Depression    GERD (gastroesophageal reflux disease)    diet related     Past Surgical History:  Procedure Laterality Date   WISDOM TOOTH EXTRACTION      Social History:  reports that she has quit smoking. She has never used smokeless tobacco. She reports that she does not currently use alcohol. She reports that she does not use drugs.  Allergies: Allergies  Allergen Reactions   Lisinopril Cough   Sulfa Antibiotics Hives    Family History:  Family History  Problem Relation Age of Onset   Hypertension Mother    Stomach cancer Maternal Grandmother    CAD Maternal Grandfather    Heart attack Maternal Grandfather    Breast cancer Maternal Aunt 36   Cancer Father        Skin   Colon cancer Neg Hx    Colon polyps Neg Hx    Esophageal cancer Neg Hx    Rectal cancer Neg Hx      Current Outpatient Medications:    atorvastatin (LIPITOR) 40 MG tablet, Take 1 tablet (40 mg total) by mouth daily., Disp: 90 tablet, Rfl: 1   cyclobenzaprine  (FLEXERIL) 10 MG tablet, Take 1 tablet (10 mg total) by mouth 3 (three) times daily as needed for muscle spasms., Disp: 30 tablet, Rfl: 0   glucosamine-chondroitin 500-400 MG tablet, Take 1 tablet by mouth daily., Disp: , Rfl:    Grape Seed 100 MG CAPS, Take by mouth., Disp: , Rfl:    milk thistle 175 MG tablet, Take 175 mg by mouth daily., Disp: , Rfl:    Misc Natural Products (OSTEO BI-FLEX/5-LOXIN ADVANCED PO), Take by mouth., Disp: , Rfl:    Multiple Vitamin (MULTIVITAMIN) tablet, Take 1 tablet by mouth daily., Disp: , Rfl:    Multiple Vitamins-Minerals (OCUVITE ADULT 50+ PO), Take by mouth., Disp: , Rfl:    Omega-3 Fatty Acids (FISH OIL) 1000 MG CAPS, Take by mouth., Disp: , Rfl:    Probiotic Product (PROBIOTIC PO), Take by mouth., Disp: , Rfl:    PSYLLIUM FIBER PO, Take by mouth., Disp: , Rfl:    TURMERIC PO, Take by mouth., Disp: , Rfl:    vitamin B-12 (CYANOCOBALAMIN) 500 MCG tablet, Take 500 mcg by mouth daily., Disp: , Rfl:   Review of Systems:  Constitutional: Denies fever, chills, diaphoresis, appetite change and fatigue.  HEENT: Denies photophobia, eye pain, redness, hearing loss, ear pain, congestion, sore throat, rhinorrhea, sneezing, mouth sores, trouble  swallowing, neck pain, neck stiffness and tinnitus.   Respiratory: Denies SOB, DOE, cough, chest tightness,  and wheezing.   Cardiovascular: Denies chest pain, palpitations and leg swelling.  Gastrointestinal: Denies nausea, vomiting, abdominal pain, diarrhea, constipation, blood in stool and abdominal distention.  Genitourinary: Denies dysuria, urgency, frequency, hematuria, flank pain and difficulty urinating.  Endocrine: Denies: hot or cold intolerance, sweats, changes in hair or nails, polyuria, polydipsia. Skin: Denies pallor, rash and wound.  Neurological: Denies dizziness, seizures, syncope, weakness, light-headedness, numbness and headaches.  Hematological: Denies adenopathy. Easy bruising, personal or family bleeding  history  Psychiatric/Behavioral: Denies suicidal ideation, mood changes, confusion, nervousness, sleep disturbance and agitation    Physical Exam: Vitals:   03/05/22 1613  BP: 130/74  Pulse: 76  Temp: 98.7 F (37.1 C)  TempSrc: Oral  SpO2: 99%  Weight: 141 lb (64 kg)    Body mass index is 23.11 kg/m.   Constitutional: NAD, calm, comfortable Eyes: PERRL, lids and conjunctivae normal, wears corrective lenses ENMT: Mucous membranes are moist.  Respiratory: clear to auscultation bilaterally, no wheezing, no crackles. Normal respiratory effort. No accessory muscle use.  Cardiovascular: Regular rate and rhythm, no murmurs / rubs / gallops. No extremity edema.   Psychiatric: Normal judgment and insight. Alert and oriented x 3. Normal mood.    Impression and Plan:  Primary hypertension - Plan: Comprehensive metabolic panel  Orthostatic hypotension  Primary osteoarthritis of right hip - Plan: cyclobenzaprine (FLEXERIL) 10 MG tablet  Mixed hyperlipidemia - Plan: Lipid panel  -Blood pressure looks good in office today off Diovan HCT.  Suspect she no longer needs this dose of antihypertensive given her significant 45 pound weight loss. -She will stay off Diovan HCT for now and continue to do ambulatory blood pressure monitoring.  We will touch base again in 3 months for follow-up. -Check lipids and kidney function today. -She has severe osteoarthritis of her right hip, has a surgical consultation in January with orthopedics, is requesting muscle relaxers.  I will send in some Flexeril.  Time spent:31 minutes reviewing chart, interviewing and examining patient and formulating plan of care.      Lelon Frohlich, MD [facility]

## 2022-03-06 ENCOUNTER — Other Ambulatory Visit (INDEPENDENT_AMBULATORY_CARE_PROVIDER_SITE_OTHER): Payer: Medicaid Other

## 2022-03-06 DIAGNOSIS — I10 Essential (primary) hypertension: Secondary | ICD-10-CM

## 2022-03-06 DIAGNOSIS — E782 Mixed hyperlipidemia: Secondary | ICD-10-CM | POA: Diagnosis not present

## 2022-03-06 LAB — COMPREHENSIVE METABOLIC PANEL
ALT: 20 U/L (ref 0–35)
AST: 19 U/L (ref 0–37)
Albumin: 4.3 g/dL (ref 3.5–5.2)
Alkaline Phosphatase: 69 U/L (ref 39–117)
BUN: 9 mg/dL (ref 6–23)
CO2: 29 mEq/L (ref 19–32)
Calcium: 9.4 mg/dL (ref 8.4–10.5)
Chloride: 105 mEq/L (ref 96–112)
Creatinine, Ser: 0.64 mg/dL (ref 0.40–1.20)
GFR: 99.55 mL/min (ref >60.00)
Glucose, Bld: 91 mg/dL (ref 70–99)
Potassium: 4.4 mEq/L (ref 3.5–5.1)
Sodium: 140 mEq/L (ref 135–145)
Total Bilirubin: 0.8 mg/dL (ref 0.2–1.2)
Total Protein: 6.4 g/dL (ref 6.0–8.3)

## 2022-03-06 LAB — LIPID PANEL
Cholesterol: 166 mg/dL (ref 0–200)
HDL: 49.2 mg/dL (ref >39.00)
LDL Cholesterol: 104 mg/dL — ABNORMAL HIGH (ref 0–99)
NonHDL: 116.66
Total CHOL/HDL Ratio: 3
Triglycerides: 63 mg/dL (ref 0.0–149.0)
VLDL: 12.6 mg/dL (ref 0.0–40.0)

## 2022-03-19 DIAGNOSIS — Z419 Encounter for procedure for purposes other than remedying health state, unspecified: Secondary | ICD-10-CM | POA: Diagnosis not present

## 2022-03-21 ENCOUNTER — Ambulatory Visit: Payer: Commercial Managed Care - HMO | Admitting: Orthopaedic Surgery

## 2022-03-29 ENCOUNTER — Ambulatory Visit
Admission: RE | Admit: 2022-03-29 | Discharge: 2022-03-29 | Disposition: A | Discharge status: Home / Self Care | Payer: Medicaid Other | Source: Ambulatory Visit | Attending: Internal Medicine | Admitting: Internal Medicine

## 2022-03-29 DIAGNOSIS — Z1231 Encounter for screening mammogram for malignant neoplasm of breast: Secondary | ICD-10-CM | POA: Diagnosis not present

## 2022-04-04 ENCOUNTER — Ambulatory Visit (INDEPENDENT_AMBULATORY_CARE_PROVIDER_SITE_OTHER): Payer: Medicaid Other | Admitting: Orthopaedic Surgery

## 2022-04-04 ENCOUNTER — Ambulatory Visit (INDEPENDENT_AMBULATORY_CARE_PROVIDER_SITE_OTHER): Payer: Medicaid Other

## 2022-04-04 DIAGNOSIS — M1611 Unilateral primary osteoarthritis, right hip: Secondary | ICD-10-CM

## 2022-04-04 NOTE — Progress Notes (Signed)
Office Visit Note   Patient: [name]           Date of Birth: [birth year]           MRN: [id number] Visit Date: 04/04/2022              Requested by: Isaac Bliss, Rayford Halsted, MD [place],  [place] [id number] PCP: Isaac Bliss, Rayford Halsted, MD   Assessment & Plan: Visit Diagnoses:  1. Primary osteoarthritis of right hip     Plan: Impression is severe right hip degenerative joint disease secondary to Osteoarthritis.  Imaging shows bone on bone joint space narrowing.  At this point, conservative treatments fail to provide any significant relief and the pain is severely affecting ADLs and quality of life.  Based on treatment options, the patient has elected to move forward with a hip replacement.  We have discussed the surgical risks that include but are not limited to infection, DVT, leg length discrepancy, numbness, tingling, incomplete relief of pain.  Recovery and prognosis were also reviewed.  Jackelyn Poling will call the patient to confirm surgery date.  Current anticoagulants: No antithrombotic Postop anticoagulation: Aspirin 81 mg Diabetic: No  Prior DVT/PE: No Tobacco use: No Clearances needed for surgery: none Anticipate discharge dispo: home; she plans to have her sister come in from out of town for couple weeks postop.  Follow-Up Instructions: No follow-ups on file.   Orders:  Orders Placed This Encounter  Procedures   XR HIP UNILAT W OR W/O PELVIS 2-3 VIEWS RIGHT   No orders of the defined types were placed in this encounter.     Procedures: No procedures performed   Clinical Data: No additional findings.   Subjective: Chief Complaint  Patient presents with   Right Hip - Pain    HPI Patient is a very pleasant 57 year old female who comes in for follow-up of right hip osteoarthritis.  I saw her last year for this and she underwent an intra-articular steroid injection which gave relief for about 4 to 6 weeks.  The pain returned  after that and has now been constant.  She cannot sleep or sit for very long.  It is severely interfering with quality of life.  Feels like the hip is giving way and locking up.  Review of Systems  Constitutional: Negative.   HENT: Negative.    Eyes: Negative.   Respiratory: Negative.    Cardiovascular: Negative.   Endocrine: Negative.   Musculoskeletal: Negative.   Neurological: Negative.   Hematological: Negative.   Psychiatric/Behavioral: Negative.    All other systems reviewed and are negative.    Objective: Vital Signs: LMP  (LMP Unknown)   Physical Exam Vitals and nursing note reviewed.  Constitutional:      Appearance: She is well-developed.  HENT:     Head: Atraumatic.     Nose: Nose normal.  Eyes:     Extraocular Movements: Extraocular movements intact.  Cardiovascular:     Pulses: Normal pulses.  Pulmonary:     Effort: Pulmonary effort is normal.  Abdominal:     Palpations: Abdomen is soft.  Musculoskeletal:     Cervical back: Neck supple.  Skin:    General: Skin is warm.     Capillary Refill: Capillary refill takes less than 2 seconds.  Neurological:     Mental Status: She is alert. Mental status is at baseline.  Psychiatric:        Behavior: Behavior normal.  Thought Content: Thought content normal.        Judgment: Judgment normal.     Ortho Exam Examination of the right hip shows pain with logroll, FADIR, Stinchfield.  No sciatic tension signs. Specialty Comments:  No specialty comments available.  Imaging: XR HIP UNILAT W OR W/O PELVIS 2-3 VIEWS RIGHT  Result Date: 04/04/2022 Advanced degenerative joint disease with bone-on-bone joint space narrowing of right hip joint.  Spurring of femoral head    PMFS History: Patient Active Problem List   Diagnosis Date Noted   Primary osteoarthritis of right hip 06/01/2021   Primary hypertension 07/19/2020   Hyperlipidemia 02/23/2020   Abnormal TSH 02/23/2020   Vitamin D deficiency 02/23/2020    Past Medical History:  Diagnosis Date   Allergy    Anxiety    Depression    GERD (gastroesophageal reflux disease)    diet related     Family History  Problem Relation Age of Onset   Hypertension Mother    Stomach cancer Maternal Grandmother    CAD Maternal Grandfather    Heart attack Maternal Grandfather    Breast cancer Maternal Aunt 21   Cancer Father        Skin   Colon cancer Neg Hx    Colon polyps Neg Hx    Esophageal cancer Neg Hx    Rectal cancer Neg Hx     Past Surgical History:  Procedure Laterality Date   WISDOM TOOTH EXTRACTION     Social History   Occupational History   Not on file  Tobacco Use   Smoking status: Former   Smokeless tobacco: Never  Scientific laboratory technician Use: Never used  Substance and Sexual Activity   Alcohol use: Not Currently    Comment: occasional/socially   Drug use: No   Sexual activity: Yes    Birth control/protection: Post-menopausal    Comment: First IC <16 y/o, 5 [employer]

## 2022-04-19 DIAGNOSIS — Z419 Encounter for procedure for purposes other than remedying health state, unspecified: Secondary | ICD-10-CM | POA: Diagnosis not present

## 2022-05-12 ENCOUNTER — Other Ambulatory Visit: Payer: Self-pay | Admitting: Internal Medicine

## 2022-05-12 DIAGNOSIS — I10 Essential (primary) hypertension: Secondary | ICD-10-CM

## 2022-05-12 DIAGNOSIS — E785 Hyperlipidemia, unspecified: Secondary | ICD-10-CM

## 2022-05-18 DIAGNOSIS — Z419 Encounter for procedure for purposes other than remedying health state, unspecified: Secondary | ICD-10-CM | POA: Diagnosis not present

## 2022-06-04 ENCOUNTER — Ambulatory Visit (INDEPENDENT_AMBULATORY_CARE_PROVIDER_SITE_OTHER): Payer: Medicaid Other | Admitting: Internal Medicine

## 2022-06-04 ENCOUNTER — Encounter: Payer: Self-pay | Admitting: Internal Medicine

## 2022-06-04 VITALS — BP 120/78 | HR 73 | Temp 98.4°F | Wt 136.7 lb

## 2022-06-04 DIAGNOSIS — I10 Essential (primary) hypertension: Secondary | ICD-10-CM

## 2022-06-04 DIAGNOSIS — R519 Headache, unspecified: Secondary | ICD-10-CM

## 2022-06-04 NOTE — Progress Notes (Signed)
Established Patient Office Visit     CC/Reason for Visit: Follow-up blood pressure, discuss headache  HPI: [name] is a 57 y.o. female who is coming in today for the above mentioned reasons. Past Medical History is significant for: Hypertension.  She was taken off Diovan after hypertension that was noted after 45 pound weight loss.  She has lost an additional 4 pounds since last visit.  Blood pressure is well-controlled off all medication.  She has been having for the past 2 days a headache centered around the frontal and crown of her head.  Feels a little lightheadedness.  Sensation is "hard to describe".   Past Medical/Surgical History: Past Medical History:  Diagnosis Date   Allergy    Anxiety    Depression    GERD (gastroesophageal reflux disease)    diet related     Past Surgical History:  Procedure Laterality Date   WISDOM TOOTH EXTRACTION      Social History:  reports that she has quit smoking. She has never used smokeless tobacco. She reports that she does not currently use alcohol. She reports that she does not use drugs.  Allergies: Allergies  Allergen Reactions   Lisinopril Cough   Sulfa Antibiotics Hives    Family History:  Family History  Problem Relation Age of Onset   Hypertension Mother    Stomach cancer Maternal Grandmother    CAD Maternal Grandfather    Heart attack Maternal Grandfather    Breast cancer Maternal Aunt 58   Cancer Father        Skin   Colon cancer Neg Hx    Colon polyps Neg Hx    Esophageal cancer Neg Hx    Rectal cancer Neg Hx      Current Outpatient Medications:    atorvastatin (LIPITOR) 40 MG tablet, TAKE ONE TABLET BY MOUTH DAILY, Disp: 90 tablet, Rfl: 1   glucosamine-chondroitin 500-400 MG tablet, Take 1 tablet by mouth daily., Disp: , Rfl:    Grape Seed 100 MG CAPS, Take by mouth., Disp: , Rfl:    milk thistle 175 MG tablet, Take 175 mg by mouth daily., Disp: , Rfl:    Misc Natural Products (OSTEO  BI-FLEX/5-LOXIN ADVANCED PO), Take by mouth., Disp: , Rfl:    Multiple Vitamin (MULTIVITAMIN) tablet, Take 1 tablet by mouth daily., Disp: , Rfl:    Multiple Vitamins-Minerals (OCUVITE ADULT 50+ PO), Take by mouth., Disp: , Rfl:    Omega-3 Fatty Acids (FISH OIL) 1000 MG CAPS, Take by mouth., Disp: , Rfl:    Probiotic Product (PROBIOTIC PO), Take by mouth., Disp: , Rfl:    PSYLLIUM FIBER PO, Take by mouth., Disp: , Rfl:    TURMERIC PO, Take by mouth., Disp: , Rfl:    vitamin B-12 (CYANOCOBALAMIN) 500 MCG tablet, Take 500 mcg by mouth daily., Disp: , Rfl:    valsartan-hydrochlorothiazide (DIOVAN-HCT) 160-25 MG tablet, TAKE ONE TABLET BY MOUTH DAILY (Patient not taking: Reported on 06/04/2022), Disp: 90 tablet, Rfl: 1  Review of Systems:  Negative unless indicated in HPI.   Physical Exam: Vitals:   06/04/22 1502  BP: 120/78  Pulse: 73  Temp: 98.4 F (36.9 C)  TempSrc: Oral  SpO2: 100%  Weight: 136 lb 11.2 oz (62 kg)    Body mass index is 22.4 kg/m.   Physical Exam Vitals reviewed.  Constitutional:      Appearance: Normal appearance.  HENT:     Head: Normocephalic and atraumatic.  Eyes:  Conjunctiva/sclera: Conjunctivae normal.     Pupils: Pupils are equal, round, and reactive to light.  Skin:    General: Skin is warm and dry.  Neurological:     General: No focal deficit present.     Mental Status: She is alert and oriented to person, place, and time.  Psychiatric:        Mood and Affect: Mood normal.        Behavior: Behavior normal.        Thought Content: Thought content normal.        Judgment: Judgment normal.      Impression and Plan:  Primary hypertension  Sinus headache  -Blood pressure is well-controlled off all blood pressure medication. -Suspect headache is sinus induced.  Advised use of daily antihistamine and guaifenesin for the next couple weeks.   Time spent:30 minutes reviewing chart, interviewing and examining patient and formulating plan  of care.     Lelon Frohlich, MD [facility]

## 2022-06-18 DIAGNOSIS — Z419 Encounter for procedure for purposes other than remedying health state, unspecified: Secondary | ICD-10-CM | POA: Diagnosis not present

## 2022-07-11 ENCOUNTER — Other Ambulatory Visit: Payer: Self-pay

## 2022-07-18 DIAGNOSIS — Z419 Encounter for procedure for purposes other than remedying health state, unspecified: Secondary | ICD-10-CM | POA: Diagnosis not present

## 2022-08-10 NOTE — Pre-Procedure Instructions (Signed)
Surgical Instructions    Your procedure is scheduled on August 20, 2022.  Report to [facility] Main Entrance "A" at 5:30 A.M., then check in with the Admitting office.  Call this number if you have problems the morning of surgery:  [phone]  If you have any questions prior to your surgery date call [phone]: Open Monday-Friday 8am-4pm If you experience any cold or flu symptoms such as cough, fever, chills, shortness of breath, etc. between now and your scheduled surgery, please notify us at the above number.     Remember:  Do not eat after midnight the night before your surgery  You may drink clear liquids until 4:30 AM the morning of your surgery.   Clear liquids allowed are: Water, Non-Citrus Juices (without pulp), Carbonated Beverages, Clear Tea, Black Coffee Only (NO MILK, CREAM OR POWDERED CREAMER of any kind), and Gatorade.  Patient Instructions  The night before surgery:  No food after midnight. ONLY clear liquids after midnight  The day of surgery (if you do NOT have diabetes):  Drink ONE (1) Pre-Surgery Clear Ensure by 4:30 AM the morning of surgery. Drink in one sitting. Do not sip.  This drink was given to you during your hospital  pre-op appointment visit.  Nothing else to drink after completing the  Pre-Surgery Clear Ensure.         If you have questions, please contact your surgeon's office.     Take these medicines the morning of surgery with A SIP OF WATER:  atorvastatin (LIPITOR)    As of today, STOP taking any Aspirin (unless otherwise instructed by your surgeon) Aleve, Naproxen, Ibuprofen, Motrin, Advil, Goody's, BC's, all herbal medications, fish oil, and all vitamins.                     Do NOT Smoke (Tobacco/Vaping) for 24 hours prior to your procedure.  If you use a CPAP at night, you may bring your mask/headgear for your overnight stay.   Contacts, glasses, piercing's, hearing aid's, dentures or partials may not be worn into surgery, please  bring cases for these belongings.    For patients admitted to the hospital, discharge time will be determined by your treatment team.   Patients discharged the day of surgery will not be allowed to drive home, and someone needs to stay with them for 24 hours.  SURGICAL WAITING ROOM VISITATION Patients having surgery or a procedure may have no more than 2 support people in the waiting area - these visitors may rotate.   Children under the age of 52 must have an adult with them who is not the patient. If the patient needs to stay at the hospital during part of their recovery, the visitor guidelines for inpatient rooms apply. Pre-op nurse will coordinate an appropriate time for 1 support person to accompany patient in pre-op.  This support person may not rotate.   Please refer to the [facility] website for the visitor guidelines for Inpatients (after your surgery is over and you are in a regular room).   If you received a COVID test during your pre-op visit  it is requested that you wear a mask when out in public, stay away from anyone that may not be feeling well and notify your surgeon if you develop symptoms. If you have been in contact with anyone that has tested positive in the last 10 days please notify you surgeon.    Pre-operative 5 CHG Bath Instructions   You can  play a key role in reducing the risk of infection after surgery. Your skin needs to be as free of germs as possible. You can reduce the number of germs on your skin by washing with CHG (chlorhexidine gluconate) soap before surgery. CHG is an antiseptic soap that kills germs and continues to kill germs even after washing.   DO NOT use if you have an allergy to chlorhexidine/CHG or antibacterial soaps. If your skin becomes reddened or irritated, stop using the CHG and notify one of our RNs at [phone].   Please shower with the CHG soap starting 4 days before surgery using the following schedule:     Please keep in mind  the following:  DO NOT shave, including legs and underarms, starting the day of your first shower.   You may shave your face at any point before/day of surgery.  Place clean sheets on your bed the day you start using CHG soap. Use a clean washcloth (not used since being washed) for each shower. DO NOT sleep with pets once you start using the CHG.   CHG Shower Instructions:  If you choose to wash your hair and private area, wash first with your normal shampoo/soap.  After you use shampoo/soap, rinse your hair and body thoroughly to remove shampoo/soap residue.  Turn the water OFF and apply about 3 tablespoons (45 ml) of CHG soap to a CLEAN washcloth.  Apply CHG soap ONLY FROM YOUR NECK DOWN TO YOUR TOES (washing for 3-5 minutes)  DO NOT use CHG soap on face, private areas, open wounds, or sores.  Pay special attention to the area where your surgery is being performed.  If you are having back surgery, having someone wash your back for you may be helpful. Wait 2 minutes after CHG soap is applied, then you may rinse off the CHG soap.  Pat dry with a clean towel  Put on clean clothes/pajamas   If you choose to wear lotion, please use ONLY the CHG-compatible lotions on the back of this paper.     Additional instructions for the day of surgery: DO NOT APPLY any lotions, deodorants, cologne, or perfumes.   Do not wear jewelry or makeup Do not wear nail polish, gel polish, artificial nails, or any other type of covering on natural nails (fingers and toes) Do not bring valuables to the hospital. [facility] is not responsible for any belongings or valuables. Put on clean/comfortable clothes.  Brush your teeth.  Ask your nurse before applying any prescription medications to the skin.      CHG Compatible Lotions   Aveeno Moisturizing lotion  Cetaphil Moisturizing Cream  Cetaphil Moisturizing Lotion  Clairol Herbal Essence Moisturizing Lotion, Dry Skin  Clairol Herbal Essence Moisturizing  Lotion, Extra Dry Skin  Clairol Herbal Essence Moisturizing Lotion, Normal Skin  Curel Age Defying Therapeutic Moisturizing Lotion with Alpha Hydroxy  Curel Extreme Care Body Lotion  Curel Soothing Hands Moisturizing Hand Lotion  Curel Therapeutic Moisturizing Cream, Fragrance-Free  Curel Therapeutic Moisturizing Lotion, Fragrance-Free  Curel Therapeutic Moisturizing Lotion, Original Formula  Eucerin Daily Replenishing Lotion  Eucerin Dry Skin Therapy Plus Alpha Hydroxy Crme  Eucerin Dry Skin Therapy Plus Alpha Hydroxy Lotion  Eucerin Original Crme  Eucerin Original Lotion  Eucerin Plus Crme Eucerin Plus Lotion  Eucerin TriLipid Replenishing Lotion  Keri Anti-Bacterial Hand Lotion  Keri Deep Conditioning Original Lotion Dry Skin Formula Softly Scented  Keri Deep Conditioning Original Lotion, Fragrance Free Sensitive Skin Formula  Keri Lotion Fast Absorbing Fragrance  Free Sensitive Skin Formula  Keri Lotion Fast Absorbing Softly Scented Dry Skin Formula  Keri Original Lotion  Keri Skin Renewal Lotion Keri Silky Smooth Lotion  Keri Silky Smooth Sensitive Skin Lotion  Nivea Body Creamy Conditioning Oil  Nivea Body Extra Enriched Lotion  Nivea Body Original Lotion  Nivea Body Sheer Moisturizing Lotion Nivea Crme  Nivea Skin Firming Lotion  NutraDerm 30 Skin Lotion  NutraDerm Skin Lotion  NutraDerm Therapeutic Skin Cream  NutraDerm Therapeutic Skin Lotion  ProShield Protective Hand Cream  Provon moisturizing lotion   Please read over the following fact sheets that you were given.

## 2022-08-14 ENCOUNTER — Other Ambulatory Visit: Payer: Self-pay

## 2022-08-14 ENCOUNTER — Encounter (HOSPITAL_COMMUNITY): Payer: Self-pay

## 2022-08-14 ENCOUNTER — Encounter (HOSPITAL_COMMUNITY)
Admission: RE | Admit: 2022-08-14 | Discharge: 2022-08-14 | Disposition: A | Discharge status: Home / Self Care | Payer: Medicaid Other | Source: Ambulatory Visit | Attending: Orthopaedic Surgery | Admitting: Orthopaedic Surgery

## 2022-08-14 ENCOUNTER — Other Ambulatory Visit: Payer: Self-pay | Admitting: Physician Assistant

## 2022-08-14 VITALS — BP 128/81 | HR 59 | Temp 98.5°F | Resp 17 | Ht 65.0 in | Wt 131.3 lb

## 2022-08-14 DIAGNOSIS — I2510 Atherosclerotic heart disease of native coronary artery without angina pectoris: Secondary | ICD-10-CM | POA: Insufficient documentation

## 2022-08-14 DIAGNOSIS — M1611 Unilateral primary osteoarthritis, right hip: Secondary | ICD-10-CM | POA: Diagnosis not present

## 2022-08-14 DIAGNOSIS — Z01818 Encounter for other preprocedural examination: Secondary | ICD-10-CM | POA: Diagnosis not present

## 2022-08-14 DIAGNOSIS — R001 Bradycardia, unspecified: Secondary | ICD-10-CM | POA: Diagnosis not present

## 2022-08-14 HISTORY — DX: Essential (primary) hypertension: I10

## 2022-08-14 HISTORY — DX: Unspecified osteoarthritis, unspecified site: M19.90

## 2022-08-14 LAB — CBC
HCT: 37.1 % (ref 36.0–46.0)
Hemoglobin: 12.3 g/dL (ref 12.0–15.0)
MCH: 31.5 pg (ref 26.0–34.0)
MCHC: 33.2 g/dL (ref 30.0–36.0)
MCV: 95.1 fL (ref 80.0–100.0)
Platelets: 139 10*3/uL — ABNORMAL LOW (ref 150–400)
RBC: 3.9 MIL/uL (ref 3.87–5.11)
RDW: 11.7 % (ref 11.5–15.5)
WBC: 7.4 10*3/uL (ref 4.0–10.5)
nRBC: 0 % (ref 0.0–0.2)

## 2022-08-14 LAB — BASIC METABOLIC PANEL WITH GFR
Anion gap: 6 (ref 5–15)
BUN: 6 mg/dL (ref 6–20)
CO2: 27 mmol/L (ref 22–32)
Calcium: 9 mg/dL (ref 8.9–10.3)
Chloride: 100 mmol/L (ref 98–111)
Creatinine, Ser: 0.62 mg/dL (ref 0.44–1.00)
GFR, Estimated: >60 mL/min
Glucose, Bld: 90 mg/dL (ref 70–99)
Potassium: 4.5 mmol/L (ref 3.5–5.1)
Sodium: 133 mmol/L — ABNORMAL LOW (ref 135–145)

## 2022-08-14 LAB — TYPE AND SCREEN
ABO/RH(D): O POS
Antibody Screen: NEGATIVE

## 2022-08-14 LAB — SURGICAL PCR SCREEN
MRSA, PCR: NEGATIVE
Staphylococcus aureus: NEGATIVE

## 2022-08-14 LAB — PREALBUMIN: Prealbumin: 25 mg/dL (ref 18–38)

## 2022-08-14 LAB — HEMOGLOBIN A1C
Hgb A1c MFr Bld: 5.7 % — ABNORMAL HIGH (ref 4.8–5.6)
Mean Plasma Glucose: 117 mg/dL

## 2022-08-14 MED ORDER — ASPIRIN 81 MG PO TBEC: 81.0000 mg | DELAYED_RELEASE_TABLET | Freq: Two times a day (BID) | ORAL | 0 refills | Status: DC

## 2022-08-14 MED ORDER — ONDANSETRON HCL 4 MG PO TABS: 4.0000 mg | ORAL_TABLET | Freq: Three times a day (TID) | ORAL | 0 refills | Status: DC | PRN

## 2022-08-14 MED ORDER — METHOCARBAMOL 750 MG PO TABS: 750.0000 mg | ORAL_TABLET | Freq: Three times a day (TID) | ORAL | 2 refills | Status: DC | PRN

## 2022-08-14 MED ORDER — DOCUSATE SODIUM 100 MG PO CAPS: 100.0000 mg | ORAL_CAPSULE | Freq: Every day | ORAL | 2 refills | Status: DC | PRN

## 2022-08-14 MED ORDER — OXYCODONE-ACETAMINOPHEN 5-325 MG PO TABS: 1.0000 | ORAL_TABLET | Freq: Four times a day (QID) | ORAL | 0 refills | Status: DC | PRN

## 2022-08-14 NOTE — Progress Notes (Signed)
PCP - Dr. Keith Rake Cardiologist - Denies  PPM/ICD - Denies Device Orders - n/a Rep Notified - n/a  Chest x-ray - Denies EKG - 08/14/2022 Stress Test - Denies ECHO - Denies Cardiac Cath - Denies  Sleep Study - Denies CPAP - n/a  No DM  Last dose of GLP1 agonist- n/a GLP1 instructions: n/a  Blood Thinner Instructions: n/a Aspirin Instructions: n/a  ERAS Protcol - Clear liquids until 0430 morning of surgery PRE-SURGERY Ensure or G2- Ensure given to pt with instructions  COVID TEST- n/a   Anesthesia review: Yes. Abnormal EKG review   Patient denies shortness of breath, fever, cough and chest pain at PAT appointment. Pt denies any respiratory illness/infection in the last two months.   All instructions explained to the patient, with a verbal understanding of the material. Patient agrees to go over the instructions while at home for a better understanding. Patient also instructed to self quarantine after being tested for COVID-19. The opportunity to ask questions was provided.

## 2022-08-17 ENCOUNTER — Telehealth: Payer: Self-pay

## 2022-08-17 NOTE — Telephone Encounter (Signed)
Patient has [employer] and scheduled for [facility] Monday 08/20/2022. Do you want patient to attend outpatient PT or just walk at home? Please advise.

## 2022-08-17 NOTE — Telephone Encounter (Signed)
Walk at home.  Thanks.

## 2022-08-18 DIAGNOSIS — Z419 Encounter for procedure for purposes other than remedying health state, unspecified: Secondary | ICD-10-CM | POA: Diagnosis not present

## 2022-08-19 NOTE — Anesthesia Preprocedure Evaluation (Signed)
Anesthesia Evaluation  Patient identified by MRN, date of birth, ID band Patient awake    Reviewed: Allergy & Precautions, NPO status , Patient's Chart, lab work & pertinent test results  Airway Mallampati: II  TM Distance: >3 FB Neck ROM: Full    Dental no notable dental hx. (+) Teeth Intact, Dental Advisory Given   Pulmonary former smoker   Pulmonary exam normal breath sounds clear to auscultation       Cardiovascular hypertension, Normal cardiovascular exam Rhythm:Regular Rate:Normal     Neuro/Psych  PSYCHIATRIC DISORDERS Anxiety Depression    negative neurological ROS     GI/Hepatic ,GERD  ,,(+)     substance abuse  marijuana use  Endo/Other  negative endocrine ROS    Renal/GU negative Renal ROS  negative genitourinary   Musculoskeletal negative musculoskeletal ROS (+)    Abdominal   Peds  Hematology negative hematology ROS (+)   Anesthesia Other Findings   Reproductive/Obstetrics                             Anesthesia Physical Anesthesia Plan  ASA: 2  Anesthesia Plan: Spinal   Post-op Pain Management: Tylenol PO (pre-op)*   Induction:   PONV Risk Score and Plan: 2 and Treatment may vary due to age or medical condition, Midazolam, Dexamethasone, Ondansetron and Propofol infusion  Airway Management Planned: Natural Airway  Additional Equipment:   Intra-op Plan:   Post-operative Plan:   Informed Consent: I have reviewed the patients History and Physical, chart, labs and discussed the procedure including the risks, benefits and alternatives for the proposed anesthesia with the patient or authorized representative who has indicated his/her understanding and acceptance.     Dental advisory given  Plan Discussed with: CRNA  Anesthesia Plan Comments:        Anesthesia Quick Evaluation

## 2022-08-20 ENCOUNTER — Ambulatory Visit (HOSPITAL_COMMUNITY): Payer: Medicaid Other | Admitting: Physician Assistant

## 2022-08-20 ENCOUNTER — Other Ambulatory Visit: Payer: Self-pay

## 2022-08-20 ENCOUNTER — Ambulatory Visit (HOSPITAL_BASED_OUTPATIENT_CLINIC_OR_DEPARTMENT_OTHER): Payer: Medicaid Other | Admitting: Certified Registered"

## 2022-08-20 ENCOUNTER — Encounter (HOSPITAL_COMMUNITY)
Admission: RE | Disposition: A | Discharge status: Home / Self Care | Payer: Self-pay | Source: Home / Self Care | Attending: Orthopaedic Surgery

## 2022-08-20 ENCOUNTER — Observation Stay (HOSPITAL_COMMUNITY): Payer: Medicaid Other

## 2022-08-20 ENCOUNTER — Observation Stay (HOSPITAL_COMMUNITY)
Admission: RE | Admit: 2022-08-20 | Discharge: 2022-08-21 | Disposition: A | Discharge status: Home / Self Care | Payer: Medicaid Other | Attending: Orthopaedic Surgery | Admitting: Orthopaedic Surgery

## 2022-08-20 ENCOUNTER — Ambulatory Visit (HOSPITAL_COMMUNITY): Payer: Medicaid Other

## 2022-08-20 ENCOUNTER — Encounter (HOSPITAL_COMMUNITY): Payer: Self-pay | Admitting: Orthopaedic Surgery

## 2022-08-20 DIAGNOSIS — Z7982 Long term (current) use of aspirin: Secondary | ICD-10-CM | POA: Diagnosis not present

## 2022-08-20 DIAGNOSIS — Z79899 Other long term (current) drug therapy: Secondary | ICD-10-CM | POA: Diagnosis not present

## 2022-08-20 DIAGNOSIS — M1611 Unilateral primary osteoarthritis, right hip: Secondary | ICD-10-CM | POA: Diagnosis not present

## 2022-08-20 DIAGNOSIS — Z96641 Presence of right artificial hip joint: Secondary | ICD-10-CM | POA: Diagnosis not present

## 2022-08-20 DIAGNOSIS — I10 Essential (primary) hypertension: Secondary | ICD-10-CM | POA: Insufficient documentation

## 2022-08-20 DIAGNOSIS — Z87891 Personal history of nicotine dependence: Secondary | ICD-10-CM | POA: Diagnosis not present

## 2022-08-20 DIAGNOSIS — Z471 Aftercare following joint replacement surgery: Secondary | ICD-10-CM | POA: Diagnosis not present

## 2022-08-20 DIAGNOSIS — F418 Other specified anxiety disorders: Secondary | ICD-10-CM

## 2022-08-20 HISTORY — PX: TOTAL HIP ARTHROPLASTY: SHX124

## 2022-08-20 LAB — ABO/RH: ABO/RH(D): O POS

## 2022-08-20 SURGERY — ARTHROPLASTY, HIP, TOTAL, ANTERIOR APPROACH
Anesthesia: Spinal | Site: Hip | Laterality: Right

## 2022-08-20 MED ORDER — SODIUM CHLORIDE 0.9 % IR SOLN
Status: DC | PRN
  Administered 2022-08-20: 1000 mL

## 2022-08-20 MED ORDER — DIPHENHYDRAMINE HCL 12.5 MG/5ML PO ELIX: 25.0000 mg | ORAL_SOLUTION | ORAL | Status: DC | PRN

## 2022-08-20 MED ORDER — LACTATED RINGERS IV SOLN: INTRAVENOUS | Status: DC

## 2022-08-20 MED ORDER — TRANEXAMIC ACID-NACL 1000-0.7 MG/100ML-% IV SOLN
1000.0000 mg | INTRAVENOUS | Status: AC
  Administered 2022-08-20: 1000 mg via INTRAVENOUS
  Filled 2022-08-20: qty 100

## 2022-08-20 MED ORDER — PHENYLEPHRINE 80 MCG/ML (10ML) SYRINGE FOR IV PUSH (FOR BLOOD PRESSURE SUPPORT)
PREFILLED_SYRINGE | INTRAVENOUS | Status: AC
  Filled 2022-08-20: qty 10

## 2022-08-20 MED ORDER — CEFAZOLIN SODIUM-DEXTROSE 2-4 GM/100ML-% IV SOLN
2.0000 g | INTRAVENOUS | Status: AC
  Administered 2022-08-20: 2 g via INTRAVENOUS
  Filled 2022-08-20: qty 100

## 2022-08-20 MED ORDER — METHOCARBAMOL 500 MG PO TABS
500.0000 mg | ORAL_TABLET | Freq: Four times a day (QID) | ORAL | Status: DC | PRN
  Administered 2022-08-20 – 2022-08-21 (×3): 500 mg via ORAL
  Filled 2022-08-20 (×3): qty 1

## 2022-08-20 MED ORDER — SODIUM CHLORIDE 0.9 % IV SOLN: INTRAVENOUS | Status: DC

## 2022-08-20 MED ORDER — PANTOPRAZOLE SODIUM 40 MG PO TBEC
40.0000 mg | DELAYED_RELEASE_TABLET | Freq: Every day | ORAL | Status: DC
  Administered 2022-08-20 – 2022-08-21 (×2): 40 mg via ORAL
  Filled 2022-08-20 (×2): qty 1

## 2022-08-20 MED ORDER — DOCUSATE SODIUM 100 MG PO CAPS
100.0000 mg | ORAL_CAPSULE | Freq: Two times a day (BID) | ORAL | Status: DC
  Administered 2022-08-20 – 2022-08-21 (×3): 100 mg via ORAL
  Filled 2022-08-20 (×3): qty 1

## 2022-08-20 MED ORDER — ONDANSETRON HCL 4 MG PO TABS: 4.0000 mg | ORAL_TABLET | Freq: Four times a day (QID) | ORAL | Status: DC | PRN

## 2022-08-20 MED ORDER — OXYCODONE HCL 5 MG PO TABS
5.0000 mg | ORAL_TABLET | ORAL | Status: DC | PRN
  Administered 2022-08-20: 5 mg via ORAL
  Filled 2022-08-20: qty 2
  Filled 2022-08-20: qty 1

## 2022-08-20 MED ORDER — METOCLOPRAMIDE HCL 5 MG/ML IJ SOLN
5.0000 mg | Freq: Three times a day (TID) | INTRAMUSCULAR | Status: DC | PRN
  Administered 2022-08-20: 10 mg via INTRAVENOUS
  Filled 2022-08-20: qty 2

## 2022-08-20 MED ORDER — TRANEXAMIC ACID-NACL 1000-0.7 MG/100ML-% IV SOLN
1000.0000 mg | Freq: Once | INTRAVENOUS | Status: AC
  Administered 2022-08-20: 1000 mg via INTRAVENOUS
  Filled 2022-08-20: qty 100

## 2022-08-20 MED ORDER — 0.9 % SODIUM CHLORIDE (POUR BTL) OPTIME
TOPICAL | Status: DC | PRN
  Administered 2022-08-20: 1000 mL

## 2022-08-20 MED ORDER — ACETAMINOPHEN 500 MG PO TABS
1000.0000 mg | ORAL_TABLET | Freq: Once | ORAL | Status: AC
  Administered 2022-08-20: 1000 mg via ORAL
  Filled 2022-08-20: qty 2

## 2022-08-20 MED ORDER — MIDAZOLAM HCL 2 MG/2ML IJ SOLN
INTRAMUSCULAR | Status: DC | PRN
  Administered 2022-08-20: 2 mg via INTRAVENOUS

## 2022-08-20 MED ORDER — TRANEXAMIC ACID 1000 MG/10ML IV SOLN
2000.0000 mg | INTRAVENOUS | Status: DC
  Filled 2022-08-20: qty 20

## 2022-08-20 MED ORDER — HYDROMORPHONE HCL 1 MG/ML IJ SOLN: 0.5000 mg | INTRAMUSCULAR | Status: DC | PRN

## 2022-08-20 MED ORDER — ONDANSETRON HCL 4 MG/2ML IJ SOLN
INTRAMUSCULAR | Status: AC
  Filled 2022-08-20: qty 2

## 2022-08-20 MED ORDER — ORAL CARE MOUTH RINSE: 15.0000 mL | Freq: Once | OROMUCOSAL | Status: AC

## 2022-08-20 MED ORDER — VANCOMYCIN HCL 1 G IV SOLR
INTRAVENOUS | Status: DC | PRN
  Administered 2022-08-20: 1000 mg via TOPICAL

## 2022-08-20 MED ORDER — ONDANSETRON HCL 4 MG/2ML IJ SOLN
4.0000 mg | Freq: Four times a day (QID) | INTRAMUSCULAR | Status: DC | PRN
  Administered 2022-08-20 (×2): 4 mg via INTRAVENOUS
  Filled 2022-08-20 (×2): qty 2

## 2022-08-20 MED ORDER — POVIDONE-IODINE 10 % EX SWAB
2.0000 | Freq: Once | CUTANEOUS | Status: AC
  Administered 2022-08-20: 2 via TOPICAL

## 2022-08-20 MED ORDER — PHENYLEPHRINE HCL-NACL 20-0.9 MG/250ML-% IV SOLN
INTRAVENOUS | Status: DC | PRN
  Administered 2022-08-20: 50 ug/min via INTRAVENOUS

## 2022-08-20 MED ORDER — CHLORHEXIDINE GLUCONATE 0.12 % MT SOLN
15.0000 mL | Freq: Once | OROMUCOSAL | Status: AC
  Administered 2022-08-20: 15 mL via OROMUCOSAL
  Filled 2022-08-20: qty 15

## 2022-08-20 MED ORDER — CEFAZOLIN SODIUM-DEXTROSE 2-4 GM/100ML-% IV SOLN
2.0000 g | Freq: Four times a day (QID) | INTRAVENOUS | Status: AC
  Administered 2022-08-20 (×2): 2 g via INTRAVENOUS
  Filled 2022-08-20 (×2): qty 100

## 2022-08-20 MED ORDER — ACETAMINOPHEN 325 MG PO TABS: 325.0000 mg | ORAL_TABLET | Freq: Four times a day (QID) | ORAL | Status: DC | PRN

## 2022-08-20 MED ORDER — PHENYLEPHRINE HCL-NACL 20-0.9 MG/250ML-% IV SOLN
INTRAVENOUS | Status: AC
  Filled 2022-08-20: qty 250

## 2022-08-20 MED ORDER — OXYCODONE HCL ER 10 MG PO T12A
10.0000 mg | EXTENDED_RELEASE_TABLET | Freq: Two times a day (BID) | ORAL | Status: DC
  Administered 2022-08-20 (×2): 10 mg via ORAL
  Filled 2022-08-20 (×2): qty 1

## 2022-08-20 MED ORDER — MIDAZOLAM HCL 2 MG/2ML IJ SOLN
INTRAMUSCULAR | Status: AC
  Filled 2022-08-20: qty 2

## 2022-08-20 MED ORDER — DEXMEDETOMIDINE HCL IN NACL 80 MCG/20ML IV SOLN
INTRAVENOUS | Status: DC | PRN
  Administered 2022-08-20: 12 ug via INTRAVENOUS

## 2022-08-20 MED ORDER — LIDOCAINE 2% (20 MG/ML) 5 ML SYRINGE
INTRAMUSCULAR | Status: AC
  Filled 2022-08-20: qty 5

## 2022-08-20 MED ORDER — POLYETHYLENE GLYCOL 3350 17 G PO PACK
17.0000 g | PACK | Freq: Every day | ORAL | Status: DC
  Administered 2022-08-20 – 2022-08-21 (×2): 17 g via ORAL
  Filled 2022-08-20 (×2): qty 1

## 2022-08-20 MED ORDER — DEXAMETHASONE SODIUM PHOSPHATE 10 MG/ML IJ SOLN
10.0000 mg | Freq: Once | INTRAMUSCULAR | Status: AC
  Administered 2022-08-21: 10 mg via INTRAVENOUS
  Filled 2022-08-20: qty 1

## 2022-08-20 MED ORDER — BUPIVACAINE-MELOXICAM ER 400-12 MG/14ML IJ SOLN
INTRAMUSCULAR | Status: DC | PRN
  Administered 2022-08-20: 400 mg

## 2022-08-20 MED ORDER — HYDROXYZINE HCL 50 MG/ML IM SOLN
50.0000 mg | Freq: Four times a day (QID) | INTRAMUSCULAR | Status: DC | PRN
  Administered 2022-08-20 – 2022-08-21 (×2): 50 mg via INTRAMUSCULAR
  Filled 2022-08-20 (×2): qty 1

## 2022-08-20 MED ORDER — MAGNESIUM CITRATE PO SOLN: 1.0000 | Freq: Once | ORAL | Status: DC | PRN

## 2022-08-20 MED ORDER — VANCOMYCIN HCL 1000 MG IV SOLR
INTRAVENOUS | Status: AC
  Filled 2022-08-20: qty 20

## 2022-08-20 MED ORDER — FENTANYL CITRATE (PF) 100 MCG/2ML IJ SOLN
INTRAMUSCULAR | Status: AC
  Filled 2022-08-20: qty 2

## 2022-08-20 MED ORDER — VALSARTAN-HYDROCHLOROTHIAZIDE 160-25 MG PO TABS: 1.0000 | ORAL_TABLET | Freq: Every day | ORAL | Status: DC

## 2022-08-20 MED ORDER — BUPIVACAINE IN DEXTROSE 0.75-8.25 % IT SOLN
INTRATHECAL | Status: DC | PRN
  Administered 2022-08-20: 1.8 mL via INTRATHECAL

## 2022-08-20 MED ORDER — PROPOFOL 10 MG/ML IV BOLUS
INTRAVENOUS | Status: DC | PRN
  Administered 2022-08-20: 30 mg via INTRAVENOUS
  Administered 2022-08-20: 20 mg via INTRAVENOUS

## 2022-08-20 MED ORDER — MENTHOL 3 MG MT LOZG: 1.0000 | LOZENGE | OROMUCOSAL | Status: DC | PRN

## 2022-08-20 MED ORDER — ASPIRIN 81 MG PO CHEW
81.0000 mg | CHEWABLE_TABLET | Freq: Two times a day (BID) | ORAL | Status: DC
  Administered 2022-08-20 – 2022-08-21 (×2): 81 mg via ORAL
  Filled 2022-08-20 (×2): qty 1

## 2022-08-20 MED ORDER — PHENOL 1.4 % MT LIQD: 1.0000 | OROMUCOSAL | Status: DC | PRN

## 2022-08-20 MED ORDER — PROPOFOL 500 MG/50ML IV EMUL
INTRAVENOUS | Status: DC | PRN
  Administered 2022-08-20: 100 ug/kg/min via INTRAVENOUS

## 2022-08-20 MED ORDER — OXYCODONE HCL 5 MG PO TABS
10.0000 mg | ORAL_TABLET | ORAL | Status: DC | PRN
  Administered 2022-08-20 – 2022-08-21 (×3): 10 mg via ORAL
  Filled 2022-08-20 (×2): qty 2

## 2022-08-20 MED ORDER — FENTANYL CITRATE (PF) 250 MCG/5ML IJ SOLN
INTRAMUSCULAR | Status: DC | PRN
  Administered 2022-08-20 (×2): 50 ug via INTRAVENOUS

## 2022-08-20 MED ORDER — EPHEDRINE SULFATE (PRESSORS) 50 MG/ML IJ SOLN
INTRAMUSCULAR | Status: DC | PRN
  Administered 2022-08-20 (×2): 5 mg via INTRAVENOUS

## 2022-08-20 MED ORDER — EPHEDRINE 5 MG/ML INJ
INTRAVENOUS | Status: AC
  Filled 2022-08-20: qty 5

## 2022-08-20 MED ORDER — DEXAMETHASONE SODIUM PHOSPHATE 10 MG/ML IJ SOLN
INTRAMUSCULAR | Status: DC | PRN
  Administered 2022-08-20: 10 mg via INTRAVENOUS

## 2022-08-20 MED ORDER — SORBITOL 70 % SOLN: 30.0000 mL | Freq: Every day | Status: DC | PRN

## 2022-08-20 MED ORDER — ALUM & MAG HYDROXIDE-SIMETH 200-200-20 MG/5ML PO SUSP: 30.0000 mL | ORAL | Status: DC | PRN

## 2022-08-20 MED ORDER — ONDANSETRON HCL 4 MG/2ML IJ SOLN
INTRAMUSCULAR | Status: DC | PRN
  Administered 2022-08-20: 4 mg via INTRAVENOUS

## 2022-08-20 MED ORDER — DEXAMETHASONE SODIUM PHOSPHATE 10 MG/ML IJ SOLN
INTRAMUSCULAR | Status: AC
  Filled 2022-08-20: qty 1

## 2022-08-20 MED ORDER — FENTANYL CITRATE (PF) 250 MCG/5ML IJ SOLN
INTRAMUSCULAR | Status: AC
  Filled 2022-08-20: qty 5

## 2022-08-20 MED ORDER — DEXAMETHASONE SODIUM PHOSPHATE 10 MG/ML IJ SOLN
10.0000 mg | Freq: Once | INTRAMUSCULAR | Status: DC
  Filled 2022-08-20: qty 1

## 2022-08-20 MED ORDER — FERROUS SULFATE 325 (65 FE) MG PO TABS
325.0000 mg | ORAL_TABLET | Freq: Three times a day (TID) | ORAL | Status: DC
  Administered 2022-08-20 – 2022-08-21 (×3): 325 mg via ORAL
  Filled 2022-08-20 (×3): qty 1

## 2022-08-20 MED ORDER — PRONTOSAN WOUND IRRIGATION OPTIME
TOPICAL | Status: DC | PRN
  Administered 2022-08-20: 1 via TOPICAL

## 2022-08-20 MED ORDER — TRANEXAMIC ACID 1000 MG/10ML IV SOLN
INTRAVENOUS | Status: DC | PRN
  Administered 2022-08-20: 2000 mg via TOPICAL

## 2022-08-20 MED ORDER — METOCLOPRAMIDE HCL 5 MG PO TABS: 5.0000 mg | ORAL_TABLET | Freq: Three times a day (TID) | ORAL | Status: DC | PRN

## 2022-08-20 MED ORDER — BUPIVACAINE-MELOXICAM ER 400-12 MG/14ML IJ SOLN
INTRAMUSCULAR | Status: AC
  Filled 2022-08-20: qty 1

## 2022-08-20 MED ORDER — PROPOFOL 1000 MG/100ML IV EMUL
INTRAVENOUS | Status: AC
  Filled 2022-08-20: qty 100

## 2022-08-20 MED ORDER — FENTANYL CITRATE (PF) 100 MCG/2ML IJ SOLN
25.0000 ug | INTRAMUSCULAR | Status: DC | PRN
  Administered 2022-08-20 (×2): 25 ug via INTRAVENOUS
  Administered 2022-08-20: 50 ug via INTRAVENOUS

## 2022-08-20 MED ORDER — METHOCARBAMOL 1000 MG/10ML IJ SOLN: 500.0000 mg | Freq: Four times a day (QID) | INTRAVENOUS | Status: DC | PRN

## 2022-08-20 MED ORDER — ACETAMINOPHEN 500 MG PO TABS
1000.0000 mg | ORAL_TABLET | Freq: Four times a day (QID) | ORAL | Status: AC
  Administered 2022-08-20 – 2022-08-21 (×3): 1000 mg via ORAL
  Filled 2022-08-20 (×3): qty 2

## 2022-08-20 MED ORDER — PROPOFOL 1000 MG/100ML IV EMUL
INTRAVENOUS | Status: AC
  Filled 2022-08-20: qty 200

## 2022-08-20 SURGICAL SUPPLY — 65 items
ADH SKN CLS APL DERMABOND .7 (GAUZE/BANDAGES/DRESSINGS) ×1
BAG COUNTER SPONGE SURGICOUNT (BAG) ×1 IMPLANT
BAG DECANTER FOR FLEXI CONT (MISCELLANEOUS) ×1 IMPLANT
BAG SPNG CNTER NS LX DISP (BAG) ×1
BLADE SAG 18X100X1.27 (BLADE) ×1 IMPLANT
COVER PERINEAL POST (MISCELLANEOUS) ×1 IMPLANT
COVER SURGICAL LIGHT HANDLE (MISCELLANEOUS) ×1 IMPLANT
CUP ACET PINNACLE SECTR 48MM (Joint) IMPLANT
DERMABOND ADVANCED .7 DNX12 (GAUZE/BANDAGES/DRESSINGS) IMPLANT
DRAPE C-ARM 42X72 X-RAY (DRAPES) ×1 IMPLANT
DRAPE POUCH INSTRU U-SHP 10X18 (DRAPES) ×1 IMPLANT
DRAPE STERI IOBAN 125X83 (DRAPES) ×1 IMPLANT
DRAPE U-SHAPE 47X51 STRL (DRAPES) ×2 IMPLANT
DRSG AQUACEL AG ADV 3.5X10 (GAUZE/BANDAGES/DRESSINGS) ×1 IMPLANT
DURAPREP 26ML APPLICATOR (WOUND CARE) ×2 IMPLANT
ELECT BLADE 4.0 EZ CLEAN MEGAD (MISCELLANEOUS) ×1
ELECT REM PT RETURN 9FT ADLT (ELECTROSURGICAL) ×1
ELECTRODE BLDE 4.0 EZ CLN MEGD (MISCELLANEOUS) ×1 IMPLANT
ELECTRODE REM PT RTRN 9FT ADLT (ELECTROSURGICAL) ×1 IMPLANT
GLOVE BIOGEL PI IND STRL 7.0 (GLOVE) ×2 IMPLANT
GLOVE BIOGEL PI IND STRL 7.5 (GLOVE) ×5 IMPLANT
GLOVE ECLIPSE 7.0 STRL STRAW (GLOVE) ×2 IMPLANT
GLOVE SKINSENSE STRL SZ7.5 (GLOVE) ×1 IMPLANT
GLOVE SURG SYN 7.5  E (GLOVE) ×2
GLOVE SURG SYN 7.5 E (GLOVE) ×2 IMPLANT
GLOVE SURG SYN 7.5 PF PI (GLOVE) ×2 IMPLANT
GLOVE SURG UNDER POLY LF SZ7 (GLOVE) ×3 IMPLANT
GLOVE SURG UNDER POLY LF SZ7.5 (GLOVE) ×2 IMPLANT
GOWN STRL REUS W/ TWL LRG LVL3 (GOWN DISPOSABLE) IMPLANT
GOWN STRL REUS W/ TWL XL LVL3 (GOWN DISPOSABLE) ×1 IMPLANT
GOWN STRL REUS W/TWL LRG LVL3 (GOWN DISPOSABLE) ×1
GOWN STRL REUS W/TWL XL LVL3 (GOWN DISPOSABLE) ×1
GOWN STRL SURGICAL XL XLNG (GOWN DISPOSABLE) ×1 IMPLANT
GOWN TOGA ZIPPER T7+ PEEL AWAY (MISCELLANEOUS) ×2 IMPLANT
HANDPIECE INTERPULSE COAX TIP (DISPOSABLE) ×1
HEAD FEMORAL 32 CERAMIC (Hips) IMPLANT
HOOD PEEL AWAY T7 (MISCELLANEOUS) ×1 IMPLANT
IV NS IRRIG 3000ML ARTHROMATIC (IV SOLUTION) ×1 IMPLANT
KIT BASIN OR (CUSTOM PROCEDURE TRAY) ×1 IMPLANT
MARKER SKIN DUAL TIP RULER LAB (MISCELLANEOUS) ×1 IMPLANT
NDL SPNL 18GX3.5 QUINCKE PK (NEEDLE) ×1 IMPLANT
NEEDLE SPNL 18GX3.5 QUINCKE PK (NEEDLE) ×1 IMPLANT
PACK TOTAL JOINT (CUSTOM PROCEDURE TRAY) ×1 IMPLANT
PACK UNIVERSAL I (CUSTOM PROCEDURE TRAY) ×1 IMPLANT
PINN ALTRX NEUT ID X OD 32X48 IMPLANT
PINNSECTOR W/GRIP ACE CUP 48MM (Joint) ×1 IMPLANT
SCREW 6.5MMX25MM (Screw) IMPLANT
SET HNDPC FAN SPRY TIP SCT (DISPOSABLE) ×1 IMPLANT
SOLUTION PRONTOSAN WOUND 350ML (IRRIGATION / IRRIGATOR) ×1 IMPLANT
STAPLER VISISTAT 35W (STAPLE) IMPLANT
STEM FEM ACTIS HIGH SZ3 (Stem) IMPLANT
SUT ETHIBOND 2 V 37 (SUTURE) ×1 IMPLANT
SUT VIC AB 0 CT1 27 (SUTURE) ×1
SUT VIC AB 0 CT1 27XBRD ANBCTR (SUTURE) ×1 IMPLANT
SUT VIC AB 1 CTX 36 (SUTURE) ×1
SUT VIC AB 1 CTX36XBRD ANBCTR (SUTURE) ×1 IMPLANT
SUT VIC AB 2-0 CT1 27 (SUTURE) ×2
SUT VIC AB 2-0 CT1 TAPERPNT 27 (SUTURE) ×2 IMPLANT
SYR 50ML LL SCALE MARK (SYRINGE) ×1 IMPLANT
TOWEL GREEN STERILE (TOWEL DISPOSABLE) ×1 IMPLANT
TRAY CATH INTERMITTENT SS 16FR (CATHETERS) IMPLANT
TRAY FOLEY W/BAG SLVR 16FR (SET/KITS/TRAYS/PACK)
TRAY FOLEY W/BAG SLVR 16FR ST (SET/KITS/TRAYS/PACK) IMPLANT
TUBE SUCT ARGYLE STRL (TUBING) ×1 IMPLANT
YANKAUER SUCT BULB TIP NO VENT (SUCTIONS) ×1 IMPLANT

## 2022-08-20 NOTE — Discharge Instructions (Signed)

## 2022-08-20 NOTE — Transfer of Care (Signed)
Immediate Anesthesia Transfer of Care Note  Patient: [name]  Procedure(s) Performed: RIGHT TOTAL HIP ARTHROPLASTY ANTERIOR APPROACH (Right: Hip)  Patient Location: PACU  Anesthesia Type:Spinal  Level of Consciousness: awake and alert   Airway & Oxygen Therapy: Patient Spontanous Breathing  Post-op Assessment: Report given to RN and Post -op Vital signs reviewed and stable  Post vital signs: Reviewed and stable  Last Vitals:  Vitals Value Taken Time  BP 97/60 08/20/22 0915  Temp    Pulse 74 08/20/22 0915  Resp 10 08/20/22 0915  SpO2 94 % 08/20/22 0915  Vitals shown include unvalidated device data.  Last Pain:  Vitals:   08/20/22 0603  TempSrc:   PainSc: 7       Patients Stated Pain Goal: 1 (08/20/22 0603)  Complications: No notable events documented.

## 2022-08-20 NOTE — H&P (Signed)
PREOPERATIVE H&P  Chief Complaint: right hip osteoarthritis  HPI: [name] is a 57 y.o. female who presents for surgical treatment of right hip osteoarthritis.  She denies any changes in medical history.  Past Medical History:  Diagnosis Date   Allergy    Anxiety    Arthritis    Depression    GERD (gastroesophageal reflux disease)    diet related    Hypertension    Past Surgical History:  Procedure Laterality Date   COLONOSCOPY  2022   WISDOM TOOTH EXTRACTION     Social History   Socioeconomic History   Marital status: Married    Spouse name: Not on file   Number of children: Not on file   Years of education: Not on file   Highest education level: Not on file  Occupational History   Not on file  Tobacco Use   Smoking status: Former    Types: Cigarettes    Quit date: 2015    Years since quitting: 9.4   Smokeless tobacco: Never  Vaping Use   Vaping Use: Never used  Substance and Sexual Activity   Alcohol use: Yes    Comment: rarely   Drug use: Yes    Types: Marijuana   Sexual activity: Yes    Birth control/protection: Post-menopausal    Comment: First IC <17 y/o, 5 Partners  Other Topics Concern   Not on file  Social History Narrative   Not on file   Social Determinants of Health   Financial Resource Strain: Not on file  Food Insecurity: Not on file  Transportation Needs: Not on file  Physical Activity: Not on file  Stress: Not on file  Social Connections: Not on file   Family History  Problem Relation Age of Onset   Hypertension Mother    Stomach cancer Maternal Grandmother    CAD Maternal Grandfather    Heart attack Maternal Grandfather    Breast cancer Maternal Aunt 47   Cancer Father        Skin   Colon cancer Neg Hx    Colon polyps Neg Hx    Esophageal cancer Neg Hx    Rectal cancer Neg Hx    Allergies  Allergen Reactions   Lisinopril Cough   Sulfa Antibiotics Hives   Prior to Admission medications   Medication  Sig Start Date End Date Taking? Authorizing Provider  aspirin EC 81 MG tablet Take 1 tablet (81 mg total) by mouth 2 (two) times daily. To be taken after surgery to prevent blood clots 08/14/22 08/14/23  Cristie Hem, PA-C  atorvastatin (LIPITOR) 40 MG tablet TAKE ONE TABLET BY MOUTH DAILY 05/15/22  Yes Philip Aspen, Limmie Patricia, MD  Bioflavonoid Products (GRAPE SEED PO) Take 380 mg by mouth daily.   Yes [provider]  BIOTIN PO Take 6,000 mcg by mouth daily.   Yes [provider]  Cholecalciferol (VITAMIN D-3) 125 MCG (5000 UT) TABS Take 5,000 Units by mouth daily.   Yes [provider]  Cyanocobalamin 2500 MCG TABS Take 2,500 mcg by mouth daily.   Yes [provider]  docusate sodium (COLACE) 100 MG capsule Take 1 capsule (100 mg total) by mouth daily as needed. 08/14/22 08/14/23  Cristie Hem, PA-C  GLUCOSAMINE-CHONDROITIN PO Take 1 tablet by mouth daily. 1500 mg /1200 mg   Yes [provider]  ibuprofen (ADVIL) 200 MG tablet Take 400 mg by mouth 2 (two) times daily.   Yes [provider]  magnesium gluconate (MAGONATE) 500 MG tablet Take 500 mg by mouth daily.   Yes [provider]  Melatonin 10 MG TABS Take 10 mg by mouth at bedtime.   Yes [provider]  methocarbamol (ROBAXIN-750) 750 MG tablet Take 1 tablet (750 mg total) by mouth every 8 (eight) hours as needed for muscle spasms. 08/14/22   Cristie Hem, PA-C  Milk Thistle 1000 MG CAPS Take 1,000 mg by mouth daily.   Yes [provider]  Multiple Vitamin (MULTIVITAMIN) tablet Take 1 tablet by mouth daily. Woman 50+   Yes [provider]  Omega-3 Fatty Acids (FISH OIL PO) Take 2,400 mg by mouth daily.   Yes [provider]  ondansetron (ZOFRAN) 4 MG tablet Take 1 tablet (4 mg total) by mouth every 8 (eight) hours as needed for nausea or vomiting. 08/14/22   Cristie Hem, PA-C  oxyCODONE-acetaminophen (PERCOCET) 5-325 MG tablet Take  1-2 tablets by mouth every 6 (six) hours as needed. To be taken after surgery 08/14/22   Cristie Hem, PA-C  potassium gluconate 595 (99 K) MG TABS tablet Take 595 mg by mouth daily.   Yes [provider]  Probiotic Product (PROBIOTIC PO) Take 2 capsules by mouth daily. Pre-pro Woman   Yes [provider]  PSYLLIUM FIBER PO Take 5 capsules by mouth daily. 2.2 g each   Yes [provider]  TURMERIC PO Take 1,500 mg by mouth daily.   Yes [provider]  valsartan-hydrochlorothiazide (DIOVAN-HCT) 160-25 MG tablet TAKE ONE TABLET BY MOUTH DAILY Patient not taking: Reported on 06/04/2022 05/15/22   Philip Aspen, Limmie Patricia, MD     Positive ROS: All other systems have been reviewed and were otherwise negative with the exception of those mentioned in the HPI and as above.  Physical Exam: General: Alert, no acute distress Cardiovascular: No pedal edema Respiratory: No cyanosis, no use of accessory musculature GI: abdomen soft Skin: No lesions in the area of chief complaint Neurologic: Sensation intact distally Psychiatric: Patient is competent for consent with normal mood and affect Lymphatic: no lymphedema  MUSCULOSKELETAL: exam stable  Assessment: right hip osteoarthritis  Plan: Plan for Procedure(s): RIGHT TOTAL HIP ARTHROPLASTY ANTERIOR APPROACH  The risks benefits and alternatives were discussed with the patient including but not limited to the risks of nonoperative treatment, versus surgical intervention including infection, bleeding, nerve injury,  blood clots, cardiopulmonary complications, morbidity, mortality, among others, and they were willing to proceed.   Glee Arvin, MD 08/20/2022 6:03 AM

## 2022-08-20 NOTE — Op Note (Signed)
RIGHT TOTAL HIP ARTHROPLASTY ANTERIOR APPROACH  Procedure Note [name]   [id number]  Pre-op Diagnosis: right hip osteoarthritis     Post-op Diagnosis: same  Operative Findings Complete loss of articular cartilage   Operative Procedures  1. Total hip replacement; Right hip; uncemented [id number]   Surgeon: Gershon Mussel, M.D.  Assist: None   Anesthesia: spinal  Prosthesis: Depuy Acetabulum: Pinnacle 48 mm Femur: Actis 3 HO Head: 32 mm size: +1 Liner: +4 Bearing Type: ceramic/poly  Total Hip Arthroplasty (Anterior Approach) Op Note:  After informed consent was obtained and the operative extremity marked in the holding area, the patient was brought back to the operating room and placed supine on the HANA table. Next, the operative extremity was prepped and draped in normal sterile fashion. Surgical timeout occurred verifying patient identification, surgical site, surgical procedure and administration of antibiotics.  A Hueter approach to the hip was performed, using the interval between tensor fascia lata and sartorius.  Dissection was carried bluntly down onto the anterior hip capsule. The lateral femoral circumflex vessels were identified and coagulated. A capsulotomy was performed and the capsular flaps tagged for later repair.  The neck osteotomy was performed. The femoral head was removed which showed complete loss of articular cartilage, the acetabular rim was cleared of soft tissue and osteophytes and attention was turned to reaming the acetabulum.  Sequential reaming was performed under fluoroscopic guidance down to the floor of the cotyloid fossa. We reamed to a size 48 mm, and then impacted the acetabular shell. A 25 mm cancellous screw was placed through the shell for added fixation.  The liner was then placed after irrigation and attention turned to the femur.  After placing the femoral hook, the leg was taken to externally rotated, extended and adducted position  taking care to perform soft tissue releases to allow for adequate mobilization of the femur. Soft tissue was cleared from the shoulder of the greater trochanter and the hook elevator used to improve exposure of the proximal femur. Sequential broaching performed up to a size 3. Trial neck and head were placed. The leg was brought back up to neutral and the construct reduced.  Antibiotic irrigation was placed in the surgical wound.  The position and sizing of components, offset and leg lengths were checked using fluoroscopy. Stability of the construct was checked in extension and external rotation without any subluxation, shuck or impingement of prosthesis. We dislocated the prosthesis, dropped the leg back into position, removed trial components, and irrigated copiously. The final stem and head was then placed, the leg brought back up, the system reduced and fluoroscopy used to verify positioning.  We irrigated, obtained hemostasis and closed the capsule using #2 ethibond suture.  One gram of vancomycin powder was placed in the surgical bed.   One gram of topical tranexamic acid was injected into the joint.  The fascia was closed with #1 vicryl plus, the deep fat layer was closed with 0 vicryl, the subcutaneous layers closed with 2.0 Vicryl Plus and the skin closed with 2.0 nylon and dermabond. A sterile dressing was applied. The patient was awakened in the operating room and taken to recovery in stable condition.  All sponge, needle, and instrument counts were correct at the end of the case.   Tessa Lerner, my PA, was a medical necessity for opening, closing, limb positioning, retracting, exposing, and overall facilitation and timely completion of the surgery.  Position: supine  Complications: see description of procedure.  Time Out:  performed   Drains/Packing: none  Estimated blood loss: see anesthesia record  Returned to Recovery Room: in good condition.   Antibiotics: yes   Mechanical VTE  (DVT) Prophylaxis: sequential compression devices, TED thigh-high  Chemical VTE (DVT) Prophylaxis: aspirin   Fluid Replacement: see anesthesia record  Specimens Removed: 1 to pathology   Sponge and Instrument Count Correct? yes   PACU: portable radiograph - low AP   Plan/RTC: Return in 2 weeks for staple removal. Weight Bearing/Load Lower Extremity: full  Hip precautions: none Suture Removal: 2 weeks   N. Glee Arvin, MD Aldean Baker 8:38 AM   Implant Name Type Inv. Item Serial No. Manufacturer Lot No. LRB No. Used Action  PINNSECTOR W/GRIP ACE CUP - [id number] Joint PINNSECTOR W/GRIP ACE CUP  [employer] [id number] Right 1 Implanted  PINN ALTRX NEUT ID X OD [id number] - [id number]  PINN ALTRX NEUT ID X OD [id number]  [employer] [id number] Right 1 Implanted  SCREW 6.5MMX25MM - [id number] Screw SCREW 6.5MMX25MM  [employer] [id number] Right 1 Implanted  STEM FEM ACTIS HIGH SZ3 - [id number] Stem STEM FEM ACTIS HIGH SZ3  [employer] [id number] Right 1 Implanted  HEAD FEMORAL 32 CERAMIC - [id number] Hips HEAD FEMORAL 32 CERAMIC  [employer] [id number] Right 1 Implanted

## 2022-08-20 NOTE — Anesthesia Procedure Notes (Signed)
Spinal  Patient location during procedure: OR Start time: 08/20/2022 7:23 AM End time: 08/20/2022 7:25 AM Reason for block: surgical anesthesia Staffing Performed: anesthesiologist  Anesthesiologist: Elmer Picker, MD Performed by: Elmer Picker, MD Authorized by: Elmer Picker, MD   Preanesthetic Checklist Completed: patient identified, IV checked, risks and benefits discussed, surgical consent, monitors and equipment checked, pre-op evaluation and timeout performed Spinal Block Patient position: sitting Prep: DuraPrep and site prepped and draped Patient monitoring: cardiac monitor, continuous pulse ox and blood pressure Approach: midline Location: L3-4 Injection technique: single-shot Needle Needle type: Pencan  Needle gauge: 24 G Needle length: 9 cm Assessment Sensory level: T6 Events: CSF return Additional Notes Functioning IV was confirmed and monitors were applied. Sterile prep and drape, including hand hygiene and sterile gloves were used. The patient was positioned and the spine was prepped. The skin was anesthetized with lidocaine.  Free flow of clear CSF was obtained prior to injecting local anesthetic into the CSF.  The spinal needle aspirated freely following injection.  The needle was carefully withdrawn.  The patient tolerated the procedure well.

## 2022-08-20 NOTE — Anesthesia Postprocedure Evaluation (Signed)
Anesthesia Post Note  Patient: [name]  Procedure(s) Performed: RIGHT TOTAL HIP ARTHROPLASTY ANTERIOR APPROACH (Right: Hip)     Patient location during evaluation: PACU Anesthesia Type: Spinal Level of consciousness: oriented and awake and alert Pain management: pain level controlled Vital Signs Assessment: post-procedure vital signs reviewed and stable Respiratory status: spontaneous breathing, respiratory function stable and patient connected to nasal cannula oxygen Cardiovascular status: blood pressure returned to baseline and stable Postop Assessment: no headache, no backache and no apparent nausea or vomiting Anesthetic complications: no  No notable events documented.  Last Vitals:  Vitals:   08/20/22 1015 08/20/22 1032  BP: 112/70 (!) 142/63  Pulse: (!) 57 (!) 58  Resp: 12 20  Temp: 36.5 C   SpO2: 97% 100%    Last Pain:  Vitals:   08/20/22 1122  TempSrc:   PainSc: 8                  Cecilia Nishikawa L Jayjay Littles

## 2022-08-20 NOTE — Plan of Care (Signed)
  Problem: Education: Goal: Knowledge of the prescribed therapeutic regimen will improve Outcome: Completed/Met Goal: Understanding of discharge needs will improve Outcome: Completed/Met Goal: Individualized Educational Video(s) Outcome: Completed/Met   Problem: Activity: Goal: Ability to avoid complications of mobility impairment will improve Outcome: Completed/Met Goal: Ability to tolerate increased activity will improve Outcome: Completed/Met   Problem: Clinical Measurements: Goal: Postoperative complications will be avoided or minimized Outcome: Completed/Met   Problem: Pain Management: Goal: Pain level will decrease with appropriate interventions Outcome: Completed/Met   Problem: Skin Integrity: Goal: Will show signs of wound healing Outcome: Completed/Met   

## 2022-08-20 NOTE — Evaluation (Signed)
Physical Therapy Evaluation Patient Details Name: [name] MRN: [id number] DOB: June 19, 1965 Today's Date: 08/20/2022  History of Present Illness  57 y.o. female presents to [facility] hospital on 08/20/2022 for elective R THA. PMH includes anxiety, depression, GERD, HTN.  Clinical Impression  Pt presents to PT with deficits in strength, power, gait, balance, functional mobility, and with significant R hip pain. Pt reports 10/10 pain with full weightbearing during ambulation, but is able to ambulate for short household distances with increased time. Pt will benefit from continued frequent mobilization in an effort to improve activity tolerance and to restore independence. PT provides education on surgical hip exercise packet. PT will follow up tomorrow for further gait and stair training.       Recommendations for follow up therapy are one component of a multi-disciplinary discharge planning process, led by the attending physician.  Recommendations may be updated based on patient status, additional functional criteria and insurance authorization.  Follow Up Recommendations       Assistance Recommended at Discharge PRN  Patient can return home with the following  A little help with bathing/dressing/bathroom;Assistance with cooking/housework;Assist for transportation;Help with stairs or ramp for entrance    Equipment Recommendations Rolling walker (2 wheels);BSC/3in1  Recommendations for Other Services       Functional Status Assessment Patient has had a recent decline in their functional status and demonstrates the ability to make significant improvements in function in a reasonable and predictable amount of time.     Precautions / Restrictions Precautions Precautions: Fall Precaution Comments: direct anterior THA Restrictions Weight Bearing Restrictions: Yes RLE Weight Bearing: Weight bearing as tolerated      Mobility  Bed Mobility Overal bed mobility: Needs Assistance Bed  Mobility: Supine to Sit, Sit to Supine     Supine to sit: Supervision Sit to supine: Min guard        Transfers Overall transfer level: Needs assistance Equipment used: Rolling walker (2 wheels) Transfers: Sit to/from Stand Sit to Stand: Min guard                Ambulation/Gait Ambulation/Gait assistance: Min guard Gait Distance (Feet): 100 Feet Assistive device: Rolling walker (2 wheels) Gait Pattern/deviations: Step-through pattern, Decreased stride length Gait velocity: reduced Gait velocity interpretation: <1.31 ft/sec, indicative of household ambulator   General Gait Details: slowed step-through gait, reduced stance time on RLE  Stairs            Wheelchair Mobility    Modified Rankin (Stroke Patients Only)       Balance Overall balance assessment: Needs assistance Sitting-balance support: No upper extremity supported, Feet supported Sitting balance-Leahy Scale: Good     Standing balance support: Reliant on assistive device for balance, Single extremity supported Standing balance-Leahy Scale: Poor                               Pertinent Vitals/Pain Pain Assessment Pain Assessment: 0-10 Pain Score: 10-Worst pain ever Pain Location: R hip with weightbearing Pain Descriptors / Indicators: Aching Pain Intervention(s): Patient requesting pain meds-RN notified    Home Living Family/patient expects to be discharged to:: Private residence Living Arrangements: Children (13 y.o. dtr) Available Help at Discharge: Family;Available 24 hours/day Type of Home: House Home Access: Stairs to enter Entrance Stairs-Rails: None Entrance Stairs-Number of Steps: 3   Home Layout: One level Home Equipment: Cane - single point;Standard Environmental consultant      Prior Function Prior Level of Function :  Independent/Modified Independent;Working/employed;Driving                     Hand Dominance        Extremity/Trunk Assessment   Upper Extremity  Assessment Upper Extremity Assessment: Overall WFL for tasks assessed    Lower Extremity Assessment Lower Extremity Assessment: RLE deficits/detail RLE Deficits / Details: generalized post-op weakness RLE Sensation: decreased light touch    Cervical / Trunk Assessment Cervical / Trunk Assessment: Normal  Communication   Communication: No difficulties  Cognition Arousal/Alertness: Awake/alert Behavior During Therapy: WFL for tasks assessed/performed Overall Cognitive Status: Within Functional Limits for tasks assessed                                          General Comments General comments (skin integrity, edema, etc.): pt becomes diaphoretic, reports nausea near completion of ambulation. Sits on commode with some improvement in symptoms then assisted back to bed.    Exercises Other Exercises Other Exercises: PT provides education on surgical hip exercise handout   Assessment/Plan    PT Assessment Patient needs continued PT services  PT Problem List Decreased strength;Decreased activity tolerance;Decreased balance;Decreased mobility;Decreased knowledge of use of DME;Pain       PT Treatment Interventions DME instruction;Gait training;Stair training;Functional mobility training;Therapeutic activities;Therapeutic exercise;Balance training;Neuromuscular re-education;Patient/family education    PT Goals (Current goals can be found in the Care Plan section)  Acute Rehab PT Goals Patient Stated Goal: to return to independence, reduce pain PT Goal Formulation: With patient Time For Goal Achievement: 08/24/22 Potential to Achieve Goals: Good    Frequency 7X/week     Co-evaluation               AM-PAC PT "6 Clicks" Mobility  Outcome Measure Help needed turning from your back to your side while in a flat bed without using bedrails?: A Little Help needed moving from lying on your back to sitting on the side of a flat bed without using bedrails?: A  Little Help needed moving to and from a bed to a chair (including a wheelchair)?: A Little Help needed standing up from a chair using your arms (e.g., wheelchair or bedside chair)?: A Little Help needed to walk in hospital room?: A Little Help needed climbing 3-5 steps with a railing? : A Lot 6 Click Score: 17    End of Session   Activity Tolerance: Patient tolerated treatment well Patient left: in bed;with call bell/phone within reach;with family/visitor present Nurse Communication: Mobility status PT Visit Diagnosis: Other abnormalities of gait and mobility (R26.89);Muscle weakness (generalized) (M62.81);Pain Pain - Right/Left: Right Pain - part of body: Hip    Time: 1610-9604 PT Time Calculation (min) (ACUTE ONLY): 33 min   Charges:   PT Evaluation $PT Eval Low Complexity: 1 Low          Arlyss Gandy, PT, DPT Acute Rehabilitation Office [phone]   Arlyss Gandy 08/20/2022, 3:35 PM

## 2022-08-21 DIAGNOSIS — I10 Essential (primary) hypertension: Secondary | ICD-10-CM | POA: Diagnosis not present

## 2022-08-21 DIAGNOSIS — Z79899 Other long term (current) drug therapy: Secondary | ICD-10-CM | POA: Diagnosis not present

## 2022-08-21 DIAGNOSIS — Z7982 Long term (current) use of aspirin: Secondary | ICD-10-CM | POA: Diagnosis not present

## 2022-08-21 DIAGNOSIS — M1611 Unilateral primary osteoarthritis, right hip: Secondary | ICD-10-CM | POA: Diagnosis not present

## 2022-08-21 DIAGNOSIS — Z87891 Personal history of nicotine dependence: Secondary | ICD-10-CM | POA: Diagnosis not present

## 2022-08-21 LAB — CBC
HCT: 25.6 % — ABNORMAL LOW (ref 36.0–46.0)
Hemoglobin: 9.2 g/dL — ABNORMAL LOW (ref 12.0–15.0)
MCH: 33.8 pg (ref 26.0–34.0)
MCHC: 35.9 g/dL (ref 30.0–36.0)
MCV: 94.1 fL (ref 80.0–100.0)
Platelets: 126 10*3/uL — ABNORMAL LOW (ref 150–400)
RBC: 2.72 MIL/uL — ABNORMAL LOW (ref 3.87–5.11)
RDW: 11.9 % (ref 11.5–15.5)
WBC: 13.8 10*3/uL — ABNORMAL HIGH (ref 4.0–10.5)
nRBC: 0 % (ref 0.0–0.2)

## 2022-08-21 NOTE — Progress Notes (Signed)
Subjective: 1 Day Post-Op Procedure(s) (LRB): RIGHT TOTAL HIP ARTHROPLASTY ANTERIOR APPROACH (Right) Patient reports pain as mild.    Objective: Vital signs in last 24 hours: Temp:  [97.7 F (36.5 C)-98.5 F (36.9 C)] 98.5 F (36.9 C) (06/04 0431) Pulse Rate:  [54-79] 79 (06/04 0431) Resp:  [10-20] 18 (06/04 0431) BP: (97-142)/(51-84) 116/78 (06/04 0431) SpO2:  [93 %-100 %] 100 % (06/04 0431)  Intake/Output from previous day: 06/03 0701 - 06/04 0700 In: 2280 [P.O.:480; I.V.:1800] Out: 800 [Urine:700; Blood:100] Intake/Output this shift: No intake/output data recorded.  Recent Labs    08/21/22 0607  HGB 9.2*   Recent Labs    08/21/22 0607  WBC 13.8*  RBC 2.72*  HCT 25.6*  PLT 126*   No results for input(s): "NA", "K", "CL", "CO2", "BUN", "CREATININE", "GLUCOSE", "CALCIUM" in the last 72 hours. No results for input(s): "LABPT", "INR" in the last 72 hours.  Neurologically intact Neurovascular intact Sensation intact distally Intact pulses distally Dorsiflexion/Plantar flexion intact Incision: dressing C/D/I No cellulitis present Compartment soft   Assessment/Plan: 1 Day Post-Op Procedure(s) (LRB): RIGHT TOTAL HIP ARTHROPLASTY ANTERIOR APPROACH (Right) Advance diet Up with therapy D/C IV fluids WBAT RLE ABLA- mild and stable D/c oxycontin  D/c home once nausea improves and has cleared PT      [name] 08/21/2022, 7:33 AM

## 2022-08-21 NOTE — Progress Notes (Signed)
Patient alert and oriented, void and ambulate. Surgical site clean and dry. D/c instructions  explain and given all questions answered. Pt. D/c home with Rowling walker per order.

## 2022-08-21 NOTE — Progress Notes (Signed)
Physical Therapy Treatment Patient Details Name: [name] MRN: [id number] DOB: Nov 23, 1965 Today's Date: 08/21/2022   History of Present Illness 57 y.o. female presents to [facility] hospital on 08/20/2022 for elective R THA. PMH includes anxiety, depression, GERD, HTN.    PT Comments    Pt greeted resting in bed and agreeable to session with good progress towards acute goals. Pt requiring grossly min guard assist for transfers and gait with RW or support. Pt able to perform stair training with min guard assist and cues for sequencing with rail use and min A to steady with HHA to ascend/descend 3 steps without rail use to simulate home entry. Pt was educated on continued walker use to maximize functional independence, safety, and decrease risk for falls as well as safe car entry/exit, appropriate activity progression, ice, and HEP and compliance with pt verbalizing understanding. Pt without questions or concerns and anticipate safe discharge, with assist level outlined below, once medically cleared, will continue to follow acutely.     Recommendations for follow up therapy are one component of a multi-disciplinary discharge planning process, led by the attending physician.  Recommendations may be updated based on patient status, additional functional criteria and insurance authorization.  Follow Up Recommendations       Assistance Recommended at Discharge PRN  Patient can return home with the following A little help with bathing/dressing/bathroom;Assistance with cooking/housework;Assist for transportation;Help with stairs or ramp for entrance   Equipment Recommendations  Rolling walker (2 wheels);BSC/3in1    Recommendations for Other Services       Precautions / Restrictions Precautions Precautions: Fall Precaution Comments: direct anterior THA Restrictions Weight Bearing Restrictions: Yes RLE Weight Bearing: Weight bearing as tolerated     Mobility  Bed Mobility Overal bed  mobility: Needs Assistance Bed Mobility: Supine to Sit, Sit to Supine     Supine to sit: Supervision Sit to supine: Supervision   General bed mobility comments: supervision for safety    Transfers Overall transfer level: Needs assistance Equipment used: Rolling walker (2 wheels) Transfers: Sit to/from Stand Sit to Stand: Min guard           General transfer comment: cues to place RLE anterior prior to sitting for pain reduction    Ambulation/Gait Ambulation/Gait assistance: Min guard Gait Distance (Feet): 300 Feet Assistive device: Rolling walker (2 wheels) Gait Pattern/deviations: Step-through pattern, Decreased stride length Gait velocity: reduced     General Gait Details: slowed step-through gait, reduced stance time on RLE, cues for upright posture and increased heel stirke on R   Stairs Stairs: Yes Stairs assistance: Min guard, Min assist Stair Management: No rails, Two rails, Step to pattern, Forwards Number of Stairs: 3 (x2) General stair comments: instructed pt on sequencing with pt able to demo up/down 3 steps with handrail use with min guard for safety, min A and HHA to ascend/descend 3 steps without rail use to simulate home entry   Wheelchair Mobility    Modified Rankin (Stroke Patients Only)       Balance Overall balance assessment: Needs assistance Sitting-balance support: No upper extremity supported, Feet supported Sitting balance-Leahy Scale: Good     Standing balance support: Reliant on assistive device for balance, Single extremity supported Standing balance-Leahy Scale: Poor Standing balance comment: reliant on AD during dynamic activities                            Cognition Arousal/Alertness: Awake/alert Behavior During Therapy: [facility] for  tasks assessed/performed Overall Cognitive Status: Within Functional Limits for tasks assessed                                          Exercises      General  Comments        Pertinent Vitals/Pain Pain Assessment Pain Assessment: Faces Faces Pain Scale: Hurts little more Pain Location: R hip with weightbearing Pain Descriptors / Indicators: Aching Pain Intervention(s): Monitored during session, Limited activity within patient's tolerance    Home Living                          Prior Function            PT Goals (current goals can now be found in the care plan section) Acute Rehab PT Goals Patient Stated Goal: to return to independence, reduce pain PT Goal Formulation: With patient Time For Goal Achievement: 08/24/22 Progress towards PT goals: Progressing toward goals    Frequency    7X/week      PT Plan      Co-evaluation              AM-PAC PT "6 Clicks" Mobility   Outcome Measure  Help needed turning from your back to your side while in a flat bed without using bedrails?: A Little Help needed moving from lying on your back to sitting on the side of a flat bed without using bedrails?: A Little Help needed moving to and from a bed to a chair (including a wheelchair)?: A Little Help needed standing up from a chair using your arms (e.g., wheelchair or bedside chair)?: A Little Help needed to walk in hospital room?: A Little Help needed climbing 3-5 steps with a railing? : A Little 6 Click Score: 18    End of Session Equipment Utilized During Treatment: Gait belt Activity Tolerance: Patient tolerated treatment well Patient left: in bed;with call bell/phone within reach Nurse Communication: Mobility status;Other (comment) (pt without need for PM session) PT Visit Diagnosis: Other abnormalities of gait and mobility (R26.89);Muscle weakness (generalized) (M62.81);Pain Pain - Right/Left: Right Pain - part of body: Hip     Time: 1610-9604 PT Time Calculation (min) (ACUTE ONLY): 24 min  Charges:  $Gait Training: 23-37 mins                     Dominic Rhome R. PTA Acute Rehabilitation Services Office:  [phone]   Catalina Antigua 08/21/2022, 9:32 AM

## 2022-08-21 NOTE — Discharge Summary (Signed)
Patient ID: [name] MRN: [id number] DOB/AGE: December 30, 1965 57 y.o.  Admit date: 08/20/2022 Discharge date: 08/21/2022  Admission Diagnoses:  Principal Problem:   Primary osteoarthritis of right hip Active Problems:   Status post total replacement of right hip   Discharge Diagnoses:  Same  Past Medical History:  Diagnosis Date   Allergy    Anxiety    Arthritis    Depression    GERD (gastroesophageal reflux disease)    diet related    Hypertension     Surgeries: Procedure(s): RIGHT TOTAL HIP ARTHROPLASTY ANTERIOR APPROACH on 08/20/2022   Consultants:   Discharged Condition: Improved  Hospital Course: [name] is an 57 y.o. female who was admitted 08/20/2022 for operative treatment ofPrimary osteoarthritis of right hip. Patient has severe unremitting pain that affects sleep, daily activities, and work/hobbies. After pre-op clearance the patient was taken to the operating room on 08/20/2022 and underwent  Procedure(s): RIGHT TOTAL HIP ARTHROPLASTY ANTERIOR APPROACH.    Patient was given perioperative antibiotics:  Anti-infectives (From admission, onward)    Start     Dose/Rate Route Frequency Ordered Stop   08/20/22 1400  ceFAZolin (ANCEF) IVPB 2g/100 mL premix        2 g 200 mL/hr over 30 Minutes Intravenous Every 6 hours 08/20/22 1034 08/20/22 2108   08/20/22 0829  vancomycin (VANCOCIN) powder  Status:  Discontinued          As needed 08/20/22 0830 08/20/22 0912   08/20/22 0600  ceFAZolin (ANCEF) IVPB 2g/100 mL premix        2 g 200 mL/hr over 30 Minutes Intravenous On call to O.R. 08/20/22 0554 08/20/22 0730        Patient was given sequential compression devices, early ambulation, and chemoprophylaxis to prevent DVT.  Patient benefited maximally from hospital stay and there were no complications.    Recent vital signs: Patient Vitals for the past 24 hrs:  BP Temp Temp src Pulse Resp SpO2  08/21/22 0431 116/78 98.5 F (36.9 C) Oral 79 18 100 %   08/20/22 2315 (!) 109/52 98.2 F (36.8 C) Oral (!) 54 18 98 %  08/20/22 2015 118/69 98.4 F (36.9 C) Oral 64 18 100 %  08/20/22 1523 128/84 98.1 F (36.7 C) Oral 75 19 99 %  08/20/22 1032 (!) 142/63 -- -- (!) 58 20 100 %  08/20/22 1015 112/70 97.7 F (36.5 C) -- (!) 57 12 97 %  08/20/22 1000 111/69 -- -- 62 12 98 %  08/20/22 0945 (!) 98/56 -- -- 60 12 100 %  08/20/22 0930 (!) 100/51 -- -- 70 12 99 %  08/20/22 0915 97/60 97.8 F (36.6 C) -- 77 10 93 %     Recent laboratory studies:  Recent Labs    08/21/22 0607  WBC 13.8*  HGB 9.2*  HCT 25.6*  PLT 126*     Discharge Medications:   Allergies as of 08/21/2022       Reactions   Lisinopril Cough   Sulfa Antibiotics Hives        Medication List     STOP taking these medications    FISH OIL PO   GLUCOSAMINE-CHONDROITIN PO   ibuprofen 200 MG tablet Commonly known as: ADVIL   valsartan-hydrochlorothiazide 160-25 MG tablet Commonly known as: DIOVAN-HCT       TAKE these medications    aspirin EC 81 MG tablet Take 1 tablet (81 mg total) by mouth 2 (two) times daily. To be taken after  surgery to prevent blood clots   atorvastatin 40 MG tablet Commonly known as: LIPITOR TAKE ONE TABLET BY MOUTH DAILY   BIOTIN PO Take 6,000 mcg by mouth daily.   Cyanocobalamin 2500 MCG Tabs Take 2,500 mcg by mouth daily.   docusate sodium 100 MG capsule Commonly known as: Colace Take 1 capsule (100 mg total) by mouth daily as needed.   GRAPE SEED PO Take 380 mg by mouth daily.   magnesium gluconate 500 MG tablet Commonly known as: MAGONATE Take 500 mg by mouth daily.   Melatonin 10 MG Tabs Take 10 mg by mouth at bedtime.   methocarbamol 750 MG tablet Commonly known as: Robaxin-750 Take 1 tablet (750 mg total) by mouth every 8 (eight) hours as needed for muscle spasms.   Milk Thistle 1000 MG Caps Take 1,000 mg by mouth daily.   multivitamin tablet Take 1 tablet by mouth daily. Woman 50+   ondansetron 4 MG  tablet Commonly known as: Zofran Take 1 tablet (4 mg total) by mouth every 8 (eight) hours as needed for nausea or vomiting.   oxyCODONE-acetaminophen 5-325 MG tablet Commonly known as: Percocet Take 1-2 tablets by mouth every 6 (six) hours as needed. To be taken after surgery   potassium gluconate 595 (99 K) MG Tabs tablet Take 595 mg by mouth daily.   PROBIOTIC PO Take 2 capsules by mouth daily. Pre-pro Woman   PSYLLIUM FIBER PO Take 5 capsules by mouth daily. 2.2 g each   TURMERIC PO Take 1,500 mg by mouth daily.   Vitamin D-3 125 MCG (5000 UT) Tabs Take 5,000 Units by mouth daily.               Durable Medical Equipment  (From admission, onward)           Start     Ordered   08/20/22 1035  DME Walker rolling  Once       Question:  Patient needs a walker to treat with the following condition  Answer:  History of hip replacement   08/20/22 1034   08/20/22 1035  DME 3 n 1  Once        08/20/22 1034   08/20/22 1035  DME Bedside commode  Once       Question:  Patient needs a bedside commode to treat with the following condition  Answer:  History of hip replacement   08/20/22 1034            Diagnostic Studies: DG Pelvis Portable  Result Date: 08/20/2022 CLINICAL DATA:  Hip joint replacement. EXAM: PORTABLE PELVIS 1-2 VIEWS COMPARISON:  06/01/2021 FINDINGS: Single-view of the pelvis demonstrates a right total hip arthroplasty. The right hip appears located on this single view. Visualized pelvic bony ring is intact. Focal sclerotic focus in the left mid femur diaphysis region measures up to 1.8 cm. No comparison imaging to evaluate for stability of this sclerotic density. IMPRESSION: 1. Right hip arthroplasty without complicating features. 2. Indeterminate sclerotic focus in the left mid femur. Recommend dedicated left femur images to evaluate the sclerotic focus. Electronically Signed   By: Richarda Overlie M.D.   On: 08/20/2022 09:46   DG HIP UNILAT WITH PELVIS 1V  RIGHT  Result Date: 08/20/2022 CLINICAL DATA:  Fluoroscopic assistance for right hip arthroplasty EXAM: DG HIP (WITH OR WITHOUT PELVIS) 1V RIGHT COMPARISON:  04/04/2022 FINDINGS: Single fluoroscopic image shows degenerative changes but bony spurs in both hips. Fluoroscopy time 18 seconds. Radiation dose 1.35 mGy. IMPRESSION:  Fluoroscopic assistance was provided for right hip arthroplasty. Electronically Signed   By: Ernie Avena M.D.   On: 08/20/2022 08:42    Disposition: Discharge disposition: 01-Home or Self Care          Follow-up Information     Tarry Kos, MD. Schedule an appointment as soon as possible for a visit in 2 week(s).   Specialty: Orthopedic Surgery Contact information: [address] [place] [address] [phone]                  Signed: Cristie Hem 08/21/2022, 7:50 AM

## 2022-08-22 ENCOUNTER — Telehealth: Payer: Self-pay

## 2022-08-22 NOTE — Transitions of Care (Post Inpatient/ED Visit) (Unsigned)
   08/22/2022  Name: [name] MRN: [id number] DOB: 1965-09-22  Today's TOC FU Call Status: Today's TOC FU Call Status:: Unsuccessul Call (1st Attempt) Unsuccessful Call (1st Attempt) Date: 08/22/22  Attempted to reach the patient regarding the most recent Inpatient/ED visit.  Follow Up Plan: Additional outreach attempts will be made to reach the patient to complete the Transitions of Care (Post Inpatient/ED visit) call.   Signature   Woodfin Ganja LPN [facility] Nurse Health Advisor Direct Dial [phone]

## 2022-08-22 NOTE — Telephone Encounter (Signed)
Pt called, stating she had a missed call from you.  Pt was given your direct phone number, as stated in your previous message.

## 2022-08-23 ENCOUNTER — Telehealth: Payer: Self-pay | Admitting: Orthopaedic Surgery

## 2022-08-23 ENCOUNTER — Other Ambulatory Visit: Payer: Self-pay | Admitting: Physician Assistant

## 2022-08-23 MED ORDER — GABAPENTIN 100 MG PO CAPS: 100.0000 mg | ORAL_CAPSULE | Freq: Every evening | ORAL | 1 refills | Status: DC | PRN

## 2022-08-23 NOTE — Telephone Encounter (Signed)
Sent in gabapentin

## 2022-08-23 NOTE — Telephone Encounter (Signed)
Patient has some questions regarding her surgery she just had. She states she is having swelling in the hip and some pain, and wants to know if this is all normal. What to expect.

## 2022-08-23 NOTE — Telephone Encounter (Signed)
Called and advised pt.

## 2022-08-23 NOTE — Transitions of Care (Post Inpatient/ED Visit) (Signed)
08/23/2022  Name: [name] MRN: [id number] DOB: 1965/08/28  Today's TOC FU Call Status: Today's TOC FU Call Status:: Successful TOC FU Call Competed Unsuccessful Call (1st Attempt) Date: 08/22/22 [facility] FU Call Complete Date: 08/23/22  Transition Care Management Follow-up Telephone Call Date of Discharge: 08/21/22 Discharge Facility: [facility]) Type of Discharge: Inpatient Admission Primary Inpatient Discharge Diagnosis:: Primary osteoarthritis of right hip How have you been since you were released from the hospital?: Better Any questions or concerns?: No  Items Reviewed: Did you receive and understand the discharge instructions provided?: Yes Medications obtained,verified, and reconciled?: Yes (Medications Reviewed) Any new allergies since your discharge?: No Dietary orders reviewed?: Yes Do you have support at home?: Yes  Medications Reviewed Today: Medications Reviewed Today     Reviewed by Merleen Nicely, LPN (Licensed Practical Nurse) on 08/23/22 at 1057  Med List Status: <None>   Medication Order Taking? Sig Documenting Provider Last Dose Status Informant  aspirin EC 81 MG tablet [id number] Yes Take 1 tablet (81 mg total) by mouth 2 (two) times daily. To be taken after surgery to prevent blood clots Ivin Poot Taking Active   atorvastatin (LIPITOR) 40 MG tablet [id number] Yes TAKE ONE TABLET BY MOUTH DAILY Philip Aspen, Limmie Patricia, MD Taking Active Self  Bioflavonoid Products (GRAPE SEED PO) [id number] Yes Take 380 mg by mouth daily. [provider] Taking Active Self  BIOTIN PO [id number] Yes Take 6,000 mcg by mouth daily. [provider] Taking Active Self  Cholecalciferol (VITAMIN D-3) 125 MCG (5000 UT) TABS [id number] Yes Take 5,000 Units by mouth daily. [provider] Taking Active Self  Cyanocobalamin 2500 MCG TABS [id number] Yes Take 2,500 mcg by mouth daily. [provider] Taking Active Self  docusate  sodium (COLACE) 100 MG capsule [id number] Yes Take 1 capsule (100 mg total) by mouth daily as needed. Cristie Hem, PA-C Taking Active   magnesium gluconate (MAGONATE) 500 MG tablet [id number] Yes Take 500 mg by mouth daily. [provider] Taking Active Self  Melatonin 10 MG TABS [id number] Yes Take 10 mg by mouth at bedtime. [provider] Taking Active Self  methocarbamol (ROBAXIN-750) 750 MG tablet [id number] Yes Take 1 tablet (750 mg total) by mouth every 8 (eight) hours as needed for muscle spasms. Cristie Hem, PA-C Taking Active   Milk Thistle 1000 MG CAPS [id number] Yes Take 1,000 mg by mouth daily. [provider] Taking Active Self  Multiple Vitamin (MULTIVITAMIN) tablet [id number] Yes Take 1 tablet by mouth daily. Woman 50+ [provider] Taking Active Self  ondansetron (ZOFRAN) 4 MG tablet [id number] Yes Take 1 tablet (4 mg total) by mouth every 8 (eight) hours as needed for nausea or vomiting. Cristie Hem, PA-C Taking Active   oxyCODONE-acetaminophen (PERCOCET) 5-325 MG tablet [id number] Yes Take 1-2 tablets by mouth every 6 (six) hours as needed. To be taken after surgery Cristie Hem, PA-C Taking Active   potassium gluconate 595 (99 K) MG TABS tablet [id number] Yes Take 595 mg by mouth daily. [provider] Taking Active Self  Probiotic Product (PROBIOTIC PO) [id number] Yes Take 2 capsules by mouth daily. Pre-pro Woman [provider] Taking Active Self  PSYLLIUM FIBER PO [id number] Yes Take 5 capsules by mouth daily. 2.2 g each [provider] Taking Active Self  TURMERIC PO [id number] Yes Take 1,500 mg by mouth daily. [provider] Taking Active Self  Home Care and Equipment/Supplies: Were Home Health Services Ordered?: No (pt states she feels that she does need home health services) Any new equipment or medical supplies ordered?: Yes Name of Medical supply agency?: hospital Were you  able to get the equipment/medical supplies?: Yes Do you have any questions related to the use of the equipment/supplies?: No  Functional Questionnaire: Do you need assistance with bathing/showering or dressing?: Yes Do you need assistance with meal preparation?: Yes Do you need assistance with eating?: Yes Do you have difficulty maintaining continence: No Do you need assistance with getting out of bed/getting out of a chair/moving?: Yes Do you have difficulty managing or taking your medications?: No  Follow up appointments reviewed: PCP Follow-up appointment confirmed?: No MD Provider Line Number:[phone] Given: Yes Specialist Hospital Follow-up appointment confirmed?: Yes Date of Specialist follow-up appointment?: 09/04/22 Follow-Up Specialty Provider:: Dr Andria Frames Do you need transportation to your follow-up appointment?: No Do you understand care options if your condition(s) worsen?: Yes-patient verbalized understanding    SIGNATURE  Woodfin Ganja LPN [facility] Nurse Health Advisor Direct Dial [phone]

## 2022-08-23 NOTE — Telephone Encounter (Signed)
I called and talked to the pt. Advised swelling around thigh and pain are normal. Advised to watch out for signs of blood clot. Pt states she is having a lot of painful burning. Do you think gabapentin would help with this?

## 2022-08-27 ENCOUNTER — Telehealth: Payer: Self-pay | Admitting: Orthopaedic Surgery

## 2022-08-27 ENCOUNTER — Other Ambulatory Visit: Payer: Self-pay | Admitting: Physician Assistant

## 2022-08-27 MED ORDER — OXYCODONE-ACETAMINOPHEN 5-325 MG PO TABS: 1.0000 | ORAL_TABLET | Freq: Three times a day (TID) | ORAL | 0 refills | Status: DC | PRN

## 2022-08-27 NOTE — Telephone Encounter (Signed)
Patient called. Says she has a lot of swollen and pain. Also has not had a bowel movement in a week. Have tried OTC stool softeners and laxative. Her cb# is [phone]

## 2022-08-27 NOTE — Telephone Encounter (Signed)
Definitely recommend mag citrate, hydrate and decrease pain meds to help with constipation.  I sent in oxy, but decreased frequency

## 2022-08-27 NOTE — Telephone Encounter (Signed)
Called and spoke with patient. She has quite a bit of swelling down to her knee. We talked about elevating her leg.  She has ran out of oxycodone 5/325 and needs a refill sent to [facility] on [address].  She will pick up magnesium citrate while there to see if that will help her have a BM.

## 2022-09-04 ENCOUNTER — Ambulatory Visit (INDEPENDENT_AMBULATORY_CARE_PROVIDER_SITE_OTHER): Payer: Medicaid Other | Admitting: Physician Assistant

## 2022-09-04 DIAGNOSIS — Z96641 Presence of right artificial hip joint: Secondary | ICD-10-CM

## 2022-09-04 MED ORDER — OXYCODONE-ACETAMINOPHEN 5-325 MG PO TABS: 1.0000 | ORAL_TABLET | Freq: Three times a day (TID) | ORAL | 0 refills | Status: DC | PRN

## 2022-09-04 NOTE — Progress Notes (Signed)
Post-Op Visit Note   Patient: [name]           Date of Birth: 07-28-1965           MRN: [id number] Visit Date: 09/04/2022 PCP: Philip Aspen, Limmie Patricia, MD   Assessment & Plan:  Chief Complaint:  Chief Complaint  Patient presents with   Right Hip - Routine Post Op   Visit Diagnoses:  1. Status post total replacement of right hip     Plan: Patient is a pleasant 57 year old female who comes in today 2 weeks status post right total hip replacement 08/20/2022.  He has been in a fair amount of pain.  She is taking Percocet which is helping but not lasting any longer than 4 hours.  She is also taking Robaxin.  She has been compliant taking baby aspirin twice daily for DVT prophylaxis.  Currently ambulating with a single-point cane.  Examination of her right hip reveals a well-healed surgical incision with nylon sutures in place.  No evidence of infection or cellulitis.  Calves are soft nontender.  She does have moderate swelling distally from the knee.  Today, sutures were removed and Steri-Strips applied.  Continue wearing compression socks to the right lower extremity.  We have discussed most important form of exercise for the next 4 weeks is walking.  She has her home exercises from the hospital and will do these as well.  I do not feel she needs outpatient physical therapy.  Today, sutures were removed and Steri-Strips applied.  Continue taking baby aspirin twice daily for DVT prophylaxis.  She will follow-up with [place] in 4 weeks for repeat evaluation and AP pelvis exercises.  Call with concerns or questions.  Follow-Up Instructions: Return in about 4 weeks (around 10/02/2022).   Orders:  No orders of the defined types were placed in this encounter.  No orders of the defined types were placed in this encounter.   Imaging: No new imaging  PMFS History: Patient Active Problem List   Diagnosis Date Noted   Status post total replacement of right hip 08/20/2022   Primary  osteoarthritis of right hip 06/01/2021   Primary hypertension 07/19/2020   Hyperlipidemia 02/23/2020   Abnormal TSH 02/23/2020   Vitamin D deficiency 02/23/2020   Past Medical History:  Diagnosis Date   Allergy    Anxiety    Arthritis    Depression    GERD (gastroesophageal reflux disease)    diet related    Hypertension     Family History  Problem Relation Age of Onset   Hypertension Mother    Stomach cancer Maternal Grandmother    CAD Maternal Grandfather    Heart attack Maternal Grandfather    Breast cancer Maternal Aunt 12   Cancer Father        Skin   Colon cancer Neg Hx    Colon polyps Neg Hx    Esophageal cancer Neg Hx    Rectal cancer Neg Hx     Past Surgical History:  Procedure Laterality Date   COLONOSCOPY  2022   TOTAL HIP ARTHROPLASTY Right 08/20/2022   Procedure: RIGHT TOTAL HIP ARTHROPLASTY ANTERIOR APPROACH;  Surgeon: Tarry Kos, MD;  Location: [facility] OR;  Service: Orthopedics;  Laterality: Right;  RNFA (APRIL OR JUSTIN IF AVAILABLE)   WISDOM TOOTH EXTRACTION     Social History   Occupational History   Not on file  Tobacco Use   Smoking status: Former    Types: Cigarettes  Quit date: 2015    Years since quitting: 9.4   Smokeless tobacco: Never  Vaping Use   Vaping Use: Never used  Substance and Sexual Activity   Alcohol use: Yes    Comment: rarely   Drug use: Yes    Types: Marijuana   Sexual activity: Yes    Birth control/protection: Post-menopausal    Comment: First IC <17 y/o, 5 Partners

## 2022-09-17 DIAGNOSIS — Z419 Encounter for procedure for purposes other than remedying health state, unspecified: Secondary | ICD-10-CM | POA: Diagnosis not present

## 2022-09-24 ENCOUNTER — Other Ambulatory Visit: Payer: Self-pay | Admitting: Physician Assistant

## 2022-09-28 ENCOUNTER — Other Ambulatory Visit: Payer: Self-pay | Admitting: Physician Assistant

## 2022-09-28 ENCOUNTER — Telehealth: Payer: Self-pay | Admitting: Orthopaedic Surgery

## 2022-09-28 MED ORDER — OXYCODONE-ACETAMINOPHEN 5-325 MG PO TABS: 1.0000 | ORAL_TABLET | Freq: Four times a day (QID) | ORAL | 0 refills | Status: DC | PRN

## 2022-09-28 NOTE — Telephone Encounter (Signed)
Notified patient.

## 2022-09-28 NOTE — Telephone Encounter (Signed)
Sent in percocet

## 2022-09-28 NOTE — Telephone Encounter (Signed)
Patient called. She is in pain. Tylenol is not working. Would like someone to call her. [phone]

## 2022-10-02 ENCOUNTER — Other Ambulatory Visit (INDEPENDENT_AMBULATORY_CARE_PROVIDER_SITE_OTHER): Payer: Medicaid Other

## 2022-10-02 ENCOUNTER — Ambulatory Visit: Payer: Medicaid Other | Admitting: Physician Assistant

## 2022-10-02 DIAGNOSIS — M1611 Unilateral primary osteoarthritis, right hip: Secondary | ICD-10-CM | POA: Diagnosis not present

## 2022-10-02 MED ORDER — METHOCARBAMOL 750 MG PO TABS: 750.0000 mg | ORAL_TABLET | Freq: Three times a day (TID) | ORAL | 2 refills | Status: DC | PRN

## 2022-10-02 NOTE — Progress Notes (Signed)
Post-Op Visit Note   Patient: [name]           Date of Birth: 02/06/1966           MRN: [id number] Visit Date: 10/02/2022 PCP: Philip Aspen, Limmie Patricia, MD   Assessment & Plan:  Chief Complaint:  Chief Complaint  Patient presents with   Right Hip - Routine Post Op   Visit Diagnoses:  1. Primary osteoarthritis of right hip     Plan: Patient is a pleasant 57 year old female who comes in today's 6 weeks status post right total hip replacement 08/20/2022.  She was doing well on the left this past Friday when she woke up with sharp shooting pains within the right groin.  She denies any change in activity leading up to her pain.  No injury.  Her symptoms have dissipated.  Overall, she is feeling good with only tightness to the thigh.  She has been compliant taking a baby aspirin twice daily.  She is ambulating without assistance.  Examination of the right hip reveals painless hip flexion.  She is neurovascularly intact distally.  At this point, she may discontinue taking her baby aspirin for DVT prophylaxis.  She will follow-up with [place] in 6 weeks for recheck.  Call with concerns or questions.  Follow-Up Instructions: Return in about 6 weeks (around 11/13/2022).   Orders:  Orders Placed This Encounter  Procedures   XR Pelvis 1-2 Views   No orders of the defined types were placed in this encounter.   Imaging: No results found.  PMFS History: Patient Active Problem List   Diagnosis Date Noted   Status post total replacement of right hip 08/20/2022   Primary osteoarthritis of right hip 06/01/2021   Primary hypertension 07/19/2020   Hyperlipidemia 02/23/2020   Abnormal TSH 02/23/2020   Vitamin D deficiency 02/23/2020   Past Medical History:  Diagnosis Date   Allergy    Anxiety    Arthritis    Depression    GERD (gastroesophageal reflux disease)    diet related    Hypertension     Family History  Problem Relation Age of Onset   Hypertension Mother     Stomach cancer Maternal Grandmother    CAD Maternal Grandfather    Heart attack Maternal Grandfather    Breast cancer Maternal Aunt 95   Cancer Father        Skin   Colon cancer Neg Hx    Colon polyps Neg Hx    Esophageal cancer Neg Hx    Rectal cancer Neg Hx     Past Surgical History:  Procedure Laterality Date   COLONOSCOPY  2022   TOTAL HIP ARTHROPLASTY Right 08/20/2022   Procedure: RIGHT TOTAL HIP ARTHROPLASTY ANTERIOR APPROACH;  Surgeon: Tarry Kos, MD;  Location: [facility] OR;  Service: Orthopedics;  Laterality: Right;  RNFA (APRIL OR JUSTIN IF AVAILABLE)   WISDOM TOOTH EXTRACTION     Social History   Occupational History   Not on file  Tobacco Use   Smoking status: Former    Current packs/day: 0.00    Types: Cigarettes    Quit date: 2015    Years since quitting: 9.5   Smokeless tobacco: Never  Vaping Use   Vaping status: Never Used  Substance and Sexual Activity   Alcohol use: Yes    Comment: rarely   Drug use: Yes    Types: Marijuana   Sexual activity: Yes    Birth control/protection: Post-menopausal  Comment: First IC <17 y/o, 5 Partners

## 2022-10-12 ENCOUNTER — Other Ambulatory Visit: Payer: Self-pay | Admitting: Internal Medicine

## 2022-10-12 DIAGNOSIS — E785 Hyperlipidemia, unspecified: Secondary | ICD-10-CM

## 2022-10-18 ENCOUNTER — Other Ambulatory Visit: Payer: Self-pay | Admitting: Physician Assistant

## 2022-10-18 DIAGNOSIS — Z419 Encounter for procedure for purposes other than remedying health state, unspecified: Secondary | ICD-10-CM | POA: Diagnosis not present

## 2022-11-01 ENCOUNTER — Encounter (INDEPENDENT_AMBULATORY_CARE_PROVIDER_SITE_OTHER): Payer: Self-pay

## 2022-11-13 ENCOUNTER — Ambulatory Visit (INDEPENDENT_AMBULATORY_CARE_PROVIDER_SITE_OTHER): Payer: Medicaid Other | Admitting: Orthopaedic Surgery

## 2022-11-13 DIAGNOSIS — Z96641 Presence of right artificial hip joint: Secondary | ICD-10-CM

## 2022-11-13 NOTE — Progress Notes (Signed)
   Post-Op Visit Note   Patient: [name]           Date of Birth: 1965-04-19           MRN: [id number] Visit Date: 11/13/2022 PCP: Philip Aspen, Limmie Patricia, MD   Assessment & Plan:  Chief Complaint:  Chief Complaint  Patient presents with   Right Hip - Follow-up   Visit Diagnoses:  1. Status post total replacement of right hip     Plan: [name] is 3 months postop from right total hip replacement.  Reports muscular discomfort that is 2 out of 10 pain with leaning forward and engaging of her hip flexors.  Examination of the right hip shows fully healed surgical scar.  No signs of infection.  There is no swelling.  She has fluid painless logroll.  Slight discomfort with hip flexion and internal/external rotation.  No trochanteric tenderness.  Normal gait pattern.  Overall I am happy with [name]'s recovery.  I think she has some residual inflammation in her hip flexors possibly some weakness.  She will continue to increase activity as tolerated.  Dental prophylaxis reinforced.  Recheck in 3 months with x-rays.  Follow-Up Instructions: Return in about 3 months (around 02/13/2023).   Orders:  No orders of the defined types were placed in this encounter.  No orders of the defined types were placed in this encounter.   Imaging: No results found.  PMFS History: Patient Active Problem List   Diagnosis Date Noted   Status post total replacement of right hip 08/20/2022   Primary osteoarthritis of right hip 06/01/2021   Primary hypertension 07/19/2020   Hyperlipidemia 02/23/2020   Abnormal TSH 02/23/2020   Vitamin D deficiency 02/23/2020   Past Medical History:  Diagnosis Date   Allergy    Anxiety    Arthritis    Depression    GERD (gastroesophageal reflux disease)    diet related    Hypertension     Family History  Problem Relation Age of Onset   Hypertension Mother    Stomach cancer Maternal Grandmother    CAD Maternal Grandfather    Heart attack  Maternal Grandfather    Breast cancer Maternal Aunt 72   Cancer Father        Skin   Colon cancer Neg Hx    Colon polyps Neg Hx    Esophageal cancer Neg Hx    Rectal cancer Neg Hx     Past Surgical History:  Procedure Laterality Date   COLONOSCOPY  2022   TOTAL HIP ARTHROPLASTY Right 08/20/2022   Procedure: RIGHT TOTAL HIP ARTHROPLASTY ANTERIOR APPROACH;  Surgeon: Tarry Kos, MD;  Location: [facility] OR;  Service: Orthopedics;  Laterality: Right;  RNFA (APRIL OR JUSTIN IF AVAILABLE)   WISDOM TOOTH EXTRACTION     Social History   Occupational History   Not on file  Tobacco Use   Smoking status: Former    Current packs/day: 0.00    Types: Cigarettes    Quit date: 2015    Years since quitting: 9.6   Smokeless tobacco: Never  Vaping Use   Vaping status: Never Used  Substance and Sexual Activity   Alcohol use: Yes    Comment: rarely   Drug use: Yes    Types: Marijuana   Sexual activity: Yes    Birth control/protection: Post-menopausal    Comment: First IC <17 y/o, 5 Partners

## 2022-11-18 DIAGNOSIS — Z419 Encounter for procedure for purposes other than remedying health state, unspecified: Secondary | ICD-10-CM | POA: Diagnosis not present

## 2022-12-04 ENCOUNTER — Ambulatory Visit: Payer: Medicaid Other | Admitting: Nurse Practitioner

## 2022-12-06 ENCOUNTER — Encounter: Payer: Self-pay | Admitting: Internal Medicine

## 2022-12-06 ENCOUNTER — Ambulatory Visit (INDEPENDENT_AMBULATORY_CARE_PROVIDER_SITE_OTHER): Payer: Medicaid Other | Admitting: Internal Medicine

## 2022-12-06 VITALS — BP 110/74 | HR 76 | Temp 98.4°F | Ht 66.0 in | Wt 121.0 lb

## 2022-12-06 DIAGNOSIS — Z0000 Encounter for general adult medical examination without abnormal findings: Secondary | ICD-10-CM | POA: Diagnosis not present

## 2022-12-06 DIAGNOSIS — R7989 Other specified abnormal findings of blood chemistry: Secondary | ICD-10-CM

## 2022-12-06 DIAGNOSIS — E785 Hyperlipidemia, unspecified: Secondary | ICD-10-CM

## 2022-12-06 DIAGNOSIS — E559 Vitamin D deficiency, unspecified: Secondary | ICD-10-CM

## 2022-12-06 DIAGNOSIS — Z1159 Encounter for screening for other viral diseases: Secondary | ICD-10-CM

## 2022-12-06 DIAGNOSIS — Z114 Encounter for screening for human immunodeficiency virus [HIV]: Secondary | ICD-10-CM

## 2022-12-06 DIAGNOSIS — E782 Mixed hyperlipidemia: Secondary | ICD-10-CM | POA: Diagnosis not present

## 2022-12-06 DIAGNOSIS — Z23 Encounter for immunization: Secondary | ICD-10-CM

## 2022-12-06 DIAGNOSIS — I10 Essential (primary) hypertension: Secondary | ICD-10-CM

## 2022-12-06 LAB — CBC WITH DIFFERENTIAL/PLATELET
Basophils Absolute: 0 10*3/uL (ref 0.0–0.1)
Basophils Relative: 0.2 % (ref 0.0–3.0)
Eosinophils Absolute: 0.1 10*3/uL (ref 0.0–0.7)
Eosinophils Relative: 1.1 % (ref 0.0–5.0)
HCT: 37.2 % (ref 36.0–46.0)
Hemoglobin: 12.5 g/dL (ref 12.0–15.0)
Lymphocytes Relative: 16 % (ref 12.0–46.0)
Lymphs Abs: 1.5 10*3/uL (ref 0.7–4.0)
MCHC: 33.5 g/dL (ref 30.0–36.0)
MCV: 91.6 fl (ref 78.0–100.0)
Monocytes Absolute: 0.6 10*3/uL (ref 0.1–1.0)
Monocytes Relative: 5.7 % (ref 3.0–12.0)
Neutro Abs: 7.5 10*3/uL (ref 1.4–7.7)
Neutrophils Relative %: 77 % (ref 43.0–77.0)
Platelets: 153 10*3/uL (ref 150.0–400.0)
RBC: 4.06 Mil/uL (ref 3.87–5.11)
RDW: 13.2 % (ref 11.5–15.5)
WBC: 9.7 10*3/uL (ref 4.0–10.5)

## 2022-12-06 LAB — LIPID PANEL
Cholesterol: 99 mg/dL (ref 0–200)
HDL: 46.2 mg/dL (ref >39.00)
LDL Cholesterol: 44 mg/dL (ref 0–99)
NonHDL: 52.88
Total CHOL/HDL Ratio: 2
Triglycerides: 44 mg/dL (ref 0.0–149.0)
VLDL: 8.8 mg/dL (ref 0.0–40.0)

## 2022-12-06 LAB — COMPREHENSIVE METABOLIC PANEL
ALT: 17 U/L (ref 0–35)
AST: 19 U/L (ref 0–37)
Albumin: 4.2 g/dL (ref 3.5–5.2)
Alkaline Phosphatase: 69 U/L (ref 39–117)
BUN: 12 mg/dL (ref 6–23)
CO2: 30 mEq/L (ref 19–32)
Calcium: 9.6 mg/dL (ref 8.4–10.5)
Chloride: 100 mEq/L (ref 96–112)
Creatinine, Ser: 0.62 mg/dL (ref 0.40–1.20)
GFR: 99.79 mL/min (ref >60.00)
Glucose, Bld: 76 mg/dL (ref 70–99)
Potassium: 4.5 mEq/L (ref 3.5–5.1)
Sodium: 137 mEq/L (ref 135–145)
Total Bilirubin: 1.3 mg/dL — ABNORMAL HIGH (ref 0.2–1.2)
Total Protein: 6.7 g/dL (ref 6.0–8.3)

## 2022-12-06 MED ORDER — ATORVASTATIN CALCIUM 40 MG PO TABS
40.0000 mg | ORAL_TABLET | Freq: Every day | ORAL | 1 refills | Status: DC
Start: 2022-12-06 — End: 2023-06-03

## 2022-12-06 NOTE — Addendum Note (Signed)
Addended by: Donald Pore A on: 12/06/2022 12:12 PM   Modules accepted: Orders

## 2022-12-06 NOTE — Addendum Note (Signed)
Addended by: Kern Reap B on: 12/06/2022 10:41 AM   Modules accepted: Orders

## 2022-12-06 NOTE — Progress Notes (Signed)
Established Patient Office Visit     CC/Reason for Visit: Annual preventive exam  HPI: [name] is a 57 y.o. female who is coming in today for the above mentioned reasons. Past Medical History is significant for: Hypertension now off all medication after weight loss and lifestyle changes, hyperlipidemia, vitamin D deficiency.  Feeling well without acute concerns or complaints.  Requesting flu vaccine.   Past Medical/Surgical History: Past Medical History:  Diagnosis Date   Allergy    Anxiety    Arthritis    Depression    GERD (gastroesophageal reflux disease)    diet related    Hypertension     Past Surgical History:  Procedure Laterality Date   COLONOSCOPY  2022   TOTAL HIP ARTHROPLASTY Right 08/20/2022   Procedure: RIGHT TOTAL HIP ARTHROPLASTY ANTERIOR APPROACH;  Surgeon: Tarry Kos, MD;  Location: [facility] OR;  Service: Orthopedics;  Laterality: Right;  RNFA (APRIL OR JUSTIN IF AVAILABLE)   WISDOM TOOTH EXTRACTION      Social History:  reports that she quit smoking about 9 years ago. Her smoking use included cigarettes. She has never used smokeless tobacco. She reports current alcohol use. She reports current drug use. Drug: Marijuana.  Allergies: Allergies  Allergen Reactions   Lisinopril Cough   Sulfa Antibiotics Hives    Family History:  Family History  Problem Relation Age of Onset   Hypertension Mother    Stomach cancer Maternal Grandmother    CAD Maternal Grandfather    Heart attack Maternal Grandfather    Breast cancer Maternal Aunt 14   Cancer Father        Skin   Colon cancer Neg Hx    Colon polyps Neg Hx    Esophageal cancer Neg Hx    Rectal cancer Neg Hx      Current Outpatient Medications:    Bioflavonoid Products (GRAPE SEED PO), Take 380 mg by mouth daily., Disp: , Rfl:    BIOTIN PO, Take 6,000 mcg by mouth daily., Disp: , Rfl:    Cholecalciferol (VITAMIN D-3) 125 MCG (5000 UT) TABS, Take 5,000 Units by mouth daily., Disp: ,  Rfl:    Cyanocobalamin 2500 MCG TABS, Take 2,500 mcg by mouth daily., Disp: , Rfl:    magnesium gluconate (MAGONATE) 500 MG tablet, Take 500 mg by mouth daily., Disp: , Rfl:    Melatonin 10 MG TABS, Take 10 mg by mouth at bedtime., Disp: , Rfl:    Milk Thistle 1000 MG CAPS, Take 1,000 mg by mouth daily., Disp: , Rfl:    Multiple Vitamin (MULTIVITAMIN) tablet, Take 1 tablet by mouth daily. Woman 50+, Disp: , Rfl:    potassium gluconate 595 (99 K) MG TABS tablet, Take 595 mg by mouth daily., Disp: , Rfl:    Probiotic Product (PROBIOTIC PO), Take 2 capsules by mouth daily. Pre-pro Woman, Disp: , Rfl:    PSYLLIUM FIBER PO, Take 5 capsules by mouth daily. 2.2 g each, Disp: , Rfl:    TURMERIC PO, Take 1,500 mg by mouth daily., Disp: , Rfl:    atorvastatin (LIPITOR) 40 MG tablet, Take 1 tablet (40 mg total) by mouth daily., Disp: 90 tablet, Rfl: 1  Review of Systems:  Negative unless indicated in HPI.   Physical Exam: Vitals:   12/06/22 0958  BP: 110/74  Pulse: 76  Temp: 98.4 F (36.9 C)  TempSrc: Oral  SpO2: 96%  Weight: 121 lb (54.9 kg)  Height: 5\' 6"  (1.676 m)  Body mass index is 19.53 kg/m.   Physical Exam Vitals reviewed.  Constitutional:      General: She is not in acute distress.    Appearance: Normal appearance. She is not ill-appearing, toxic-appearing or diaphoretic.  HENT:     Head: Normocephalic.     Right Ear: Tympanic membrane, ear canal and external ear normal. There is no impacted cerumen.     Left Ear: Tympanic membrane, ear canal and external ear normal. There is no impacted cerumen.     Nose: Nose normal.     Mouth/Throat:     Mouth: Mucous membranes are moist.     Pharynx: Oropharynx is clear. No oropharyngeal exudate or posterior oropharyngeal erythema.  Eyes:     General: No scleral icterus.       Right eye: No discharge.        Left eye: No discharge.     Conjunctiva/sclera: Conjunctivae normal.     Pupils: Pupils are equal, round, and reactive to  light.  Neck:     Vascular: No carotid bruit.  Cardiovascular:     Rate and Rhythm: Normal rate and regular rhythm.     Pulses: Normal pulses.     Heart sounds: Normal heart sounds.  Pulmonary:     Effort: Pulmonary effort is normal. No respiratory distress.     Breath sounds: Normal breath sounds.  Abdominal:     General: Abdomen is flat. Bowel sounds are normal.     Palpations: Abdomen is soft.  Musculoskeletal:        General: Normal range of motion.     Cervical back: Normal range of motion.  Skin:    General: Skin is warm and dry.  Neurological:     General: No focal deficit present.     Mental Status: She is alert and oriented to person, place, and time. Mental status is at baseline.  Psychiatric:        Mood and Affect: Mood normal.        Behavior: Behavior normal.        Thought Content: Thought content normal.        Judgment: Judgment normal.     Flowsheet Row Office Visit from 12/06/2022 in [facility]  PHQ-9 Total Score 5        Impression and Plan:  Encounter for preventive health examination  Primary hypertension -     CBC with Differential/Platelet; Future -     Comprehensive metabolic panel; Future  Mixed hyperlipidemia  Abnormal TSH  Vitamin D deficiency  Hyperlipidemia, unspecified hyperlipidemia type -     Lipid panel; Future -     Atorvastatin Calcium; Take 1 tablet (40 mg total) by mouth daily.  Dispense: 90 tablet; Refill: 1  Encounter for hepatitis C screening test for low risk patient -     Hepatitis C antibody; Future  Encounter for screening for HIV -     HIV Antibody (routine testing w rflx); Future  Immunization due   -Recommend routine eye and dental care. -Healthy lifestyle discussed in detail. -Labs to be updated today. -Prostate cancer screening: N/A Health Maintenance  Topic Date Due   HIV Screening  Never done   Hepatitis C Screening  Never done   Flu Shot  10/18/2022   COVID-19  Vaccine (1 - 2023-24 season) 12/22/2022*   Mammogram  03/29/2024   Pap with HPV screening  10/04/2024   Colon Cancer Screening  12/21/2026   DTaP/Tdap/Td vaccine (2 -  Td or Tdap) 08/19/2029   Zoster (Shingles) Vaccine  Completed   HPV Vaccine  Aged Out  *Topic was postponed. The date shown is not the original due date.     -Flu vaccine in office today.  She will update COVID at pharmacy. -All cancer screenings are up-to-date.     Chaya Jan, MD [facility]

## 2022-12-18 DIAGNOSIS — Z419 Encounter for procedure for purposes other than remedying health state, unspecified: Secondary | ICD-10-CM | POA: Diagnosis not present

## 2023-01-09 ENCOUNTER — Telehealth: Payer: Self-pay | Admitting: Orthopaedic Surgery

## 2023-01-10 NOTE — Telephone Encounter (Signed)
error 

## 2023-01-18 ENCOUNTER — Encounter: Payer: Self-pay | Admitting: Orthopaedic Surgery

## 2023-01-18 DIAGNOSIS — Z419 Encounter for procedure for purposes other than remedying health state, unspecified: Secondary | ICD-10-CM | POA: Diagnosis not present

## 2023-02-13 ENCOUNTER — Ambulatory Visit: Payer: Medicaid Other | Admitting: Orthopaedic Surgery

## 2023-02-17 DIAGNOSIS — Z419 Encounter for procedure for purposes other than remedying health state, unspecified: Secondary | ICD-10-CM | POA: Diagnosis not present

## 2023-02-26 ENCOUNTER — Ambulatory Visit (INDEPENDENT_AMBULATORY_CARE_PROVIDER_SITE_OTHER): Payer: Medicaid Other | Admitting: Nurse Practitioner

## 2023-02-26 ENCOUNTER — Encounter: Payer: Self-pay | Admitting: Nurse Practitioner

## 2023-02-26 ENCOUNTER — Telehealth: Payer: Self-pay | Admitting: *Deleted

## 2023-02-26 VITALS — BP 110/78 | HR 104 | Ht 64.75 in | Wt 121.0 lb

## 2023-02-26 DIAGNOSIS — R2232 Localized swelling, mass and lump, left upper limb: Secondary | ICD-10-CM

## 2023-02-26 DIAGNOSIS — Z780 Asymptomatic menopausal state: Secondary | ICD-10-CM

## 2023-02-26 DIAGNOSIS — Z01419 Encounter for gynecological examination (general) (routine) without abnormal findings: Secondary | ICD-10-CM | POA: Diagnosis not present

## 2023-02-26 NOTE — Progress Notes (Signed)
[name] Jun 29, 1965 [id number]   History:  57 y.o. G4P4 presents for annual exam. Postmenopausal - no HRT. Normal pap history. Sees psychiatry for depression and anxiety. HTN managed by PCP. Noticed bump in left underarm a couple of months ago. Not tender, no change.   Gynecologic History No LMP recorded (lmp unknown). Patient is postmenopausal.   Contraception: post menopausal status Sexually active: Yes  Health maintenance Last Pap: 10/05/2019. Results were: Normal neg HPV, 5-year repeat Last mammogram: 03/29/2022. Results were: Normal Last colonoscopy: 12/21/2019, 7-year recall Last Dexa: Not indicated  Past medical history, past surgical history, family history and social history were all reviewed and documented in the EPIC chart. Married. 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 4 children ages 56, 86, 86, and 56   ROS:  A ROS was performed and pertinent positives and negatives are included.  Exam:  Vitals:   02/26/23 1531  BP: 110/78  Pulse: (!) 104  SpO2: 99%  Weight: 121 lb (54.9 kg)  Height: 5' 4.75" (1.645 m)     Body mass index is 20.29 kg/m.  General appearance:  Normal Thyroid:  Symmetrical, normal in size, without palpable masses or nodularity. Respiratory  Auscultation:  Clear without wheezing or rhonchi Cardiovascular  Auscultation:  Regular rate, without rubs, murmurs or gallops  Edema/varicosities:  Not grossly evident Abdominal  Soft,nontender, without masses, guarding or rebound.  Liver/spleen:  No organomegaly noted  Hernia:  None appreciated  Skin  Inspection:  Grossly normal Breasts: Examined lying and sitting.   Right: Without masses, retractions, discharge or axillary adenopathy.   Left: Without masses, retractions, discharge. ~1 cm, firm, mobile mass in axillary, non tender Pelvic: External genitalia:  no lesions              Urethra:  normal appearing urethra with no masses, tenderness or lesions              Bartholins and  Skenes: normal                 Vagina: normal appearing vagina with normal color and discharge, no lesions              Cervix: no lesions Bimanual Exam:  Uterus:  no masses or tenderness              Adnexa: no mass, fullness, tenderness              Rectovaginal: Deferred              Anus:  normal, no lesions  Patient informed chaperone available to be present for breast and pelvic exam. Patient has requested no chaperone to be present. Patient has been advised what will be completed during breast and pelvic exam.   Assessment/Plan:  57 y.o. G4P4 for annual exam.   Well female exam with routine gynecological exam - Education provided on SBEs, importance of preventative screenings, current guidelines, high calcium diet, regular exercise, and multivitamin daily. Labs with PCP.   Postmenopausal - No HRT, no bleeding.   Mass of left axilla -  ~1 cm, firm, mobile mass in axillary, non tender. Will send for imaging.   Screening for cervical cancer - Normal Pap history.  Will repeat at 5-year interval per guidelines.  Screening for breast cancer - Normal mammogram history.  Continue annual screenings.  Normal breast exam today.  Screening for colon cancer - 2021 colonoscopy. Will repeat at GI's recommended interval.   Screening for osteoporosis - average risk.  Will begin screenings at age 32.   Return in about 1 year (around 02/26/2024) for Annual.      [name] [facility], 3:52 PM 02/26/2023

## 2023-02-26 NOTE — Telephone Encounter (Signed)
-----   Message from [name] sent at 02/26/2023  3:52 PM EST ----- Regarding: Diagnostic imaging Please send referral for diagnostic imaging of left axilla for mass.

## 2023-02-27 ENCOUNTER — Other Ambulatory Visit: Payer: Self-pay | Admitting: Nurse Practitioner

## 2023-02-27 DIAGNOSIS — R2232 Localized swelling, mass and lump, left upper limb: Secondary | ICD-10-CM

## 2023-02-27 NOTE — Telephone Encounter (Signed)
Call placed to [facility], transferred to [facility] for scheduling [place] left axilla. Spoke with [facility]. Patient scheduled for [place] left axilla on 03/15/23 at 1450.   Spoke with patient, advised as seen above. Patient verbalizes understanding and is agreeable.

## 2023-03-15 ENCOUNTER — Other Ambulatory Visit: Payer: Medicaid Other

## 2023-03-20 DIAGNOSIS — Z419 Encounter for procedure for purposes other than remedying health state, unspecified: Secondary | ICD-10-CM | POA: Diagnosis not present

## 2023-04-01 ENCOUNTER — Ambulatory Visit
Admission: RE | Admit: 2023-04-01 | Discharge: 2023-04-01 | Disposition: A | Discharge status: Home / Self Care | Payer: Medicaid Other | Source: Ambulatory Visit | Attending: Nurse Practitioner | Admitting: Nurse Practitioner

## 2023-04-01 ENCOUNTER — Ambulatory Visit
Admission: RE | Admit: 2023-04-01 | Discharge: 2023-04-01 | Disposition: A | Discharge status: Home / Self Care | Payer: Medicaid Other | Source: Ambulatory Visit | Attending: Nurse Practitioner

## 2023-04-01 DIAGNOSIS — R2232 Localized swelling, mass and lump, left upper limb: Secondary | ICD-10-CM

## 2023-04-01 DIAGNOSIS — N6332 Unspecified lump in axillary tail of the left breast: Secondary | ICD-10-CM | POA: Diagnosis not present

## 2023-04-20 DIAGNOSIS — Z419 Encounter for procedure for purposes other than remedying health state, unspecified: Secondary | ICD-10-CM | POA: Diagnosis not present

## 2023-05-18 DIAGNOSIS — Z419 Encounter for procedure for purposes other than remedying health state, unspecified: Secondary | ICD-10-CM | POA: Diagnosis not present

## 2023-06-01 ENCOUNTER — Other Ambulatory Visit: Payer: Self-pay | Admitting: Internal Medicine

## 2023-06-01 DIAGNOSIS — E785 Hyperlipidemia, unspecified: Secondary | ICD-10-CM

## 2023-06-03 ENCOUNTER — Ambulatory Visit (INDEPENDENT_AMBULATORY_CARE_PROVIDER_SITE_OTHER): Admitting: Internal Medicine

## 2023-06-03 ENCOUNTER — Encounter: Payer: Self-pay | Admitting: Internal Medicine

## 2023-06-03 ENCOUNTER — Ambulatory Visit: Payer: Self-pay | Admitting: Internal Medicine

## 2023-06-03 VITALS — BP 110/70 | HR 85 | Temp 98.1°F | Wt 126.7 lb

## 2023-06-03 DIAGNOSIS — M79601 Pain in right arm: Secondary | ICD-10-CM | POA: Diagnosis not present

## 2023-06-03 MED ORDER — MELOXICAM 7.5 MG PO TABS: 7.5000 mg | ORAL_TABLET | Freq: Every day | ORAL | 0 refills | Status: AC

## 2023-06-03 NOTE — Progress Notes (Signed)
 Established Patient Office Visit     CC/Reason for Visit: Right arm pain and weakness  HPI: [name] is a 58 y.o. female who is coming in today for the above mentioned reasons.  She has been experiencing this that has gotten progressively worse over the past month or so.  Initially began as shoulder blade pain on the right which she thought was simply a muscle spasm but it has progressed to involving her neck, right arm as well as her forearm.  She is feeling a lot of pain and weakness of her right thumb as well.  No injuries that she can recall.  No numbness.   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 Her smoking use included cigarettes. She has never used smokeless tobacco. She reports current alcohol use. She reports current drug use. Drug: Marijuana.  Allergies: Allergies  Allergen Reactions   Lisinopril Cough   Sulfa Antibiotics Hives    Family History:  Family History  Problem Relation Age of Onset   Hypertension Mother    Stomach cancer Maternal Grandmother    CAD Maternal Grandfather    Heart attack Maternal Grandfather    Breast cancer Maternal Aunt 47   Cancer Father        Skin   Colon cancer Neg Hx    Colon polyps Neg Hx    Esophageal cancer Neg Hx    Rectal cancer Neg Hx      Current Outpatient Medications:    atorvastatin (LIPITOR) 40 MG tablet, TAKE 1 TABLET BY MOUTH DAILY, Disp: 90  tablet, Rfl: 1   Bioflavonoid Products (GRAPE SEED PO), Take 380 mg by mouth daily., Disp: , Rfl:    BIOTIN PO, Take 6,000 mcg by mouth daily., Disp: , Rfl:    Cholecalciferol (VITAMIN D-3) 125 MCG (5000 UT) TABS, Take 5,000 Units by mouth daily., Disp: , Rfl:    Cyanocobalamin 2500 MCG TABS, Take 2,500 mcg by mouth daily., Disp: , Rfl:    magnesium gluconate (MAGONATE) 500 MG tablet, Take 500 mg by mouth daily., Disp: , Rfl:    Melatonin 10 MG TABS, Take 10 mg by mouth at bedtime., Disp: , Rfl:    meloxicam (MOBIC) 7.5 MG tablet, Take 1 tablet (7.5 mg total) by mouth daily., Disp: 30 tablet, Rfl: 0   Milk Thistle 1000 MG CAPS, Take 1,000 mg by mouth daily., Disp: , Rfl:    Multiple Vitamin (MULTIVITAMIN) tablet, Take 1 tablet by mouth daily. Woman 50+, Disp: , Rfl:    potassium gluconate 595 (99 K) MG TABS tablet, Take 595 mg by mouth daily., Disp: , Rfl:    Probiotic Product (PROBIOTIC PO), Take 2 capsules by mouth daily. Pre-pro Woman, Disp: , Rfl:    PSYLLIUM FIBER PO, Take 5 capsules by mouth daily. 2.2 g each, Disp: , Rfl:  TURMERIC PO, Take 1,500 mg by mouth daily., Disp: , Rfl:   Review of Systems:  Negative unless indicated in HPI.   Physical Exam: Vitals:   06/03/23 1429  BP: 110/70  Pulse: 85  Temp: 98.1 F (36.7 C)  TempSrc: Oral  SpO2: 99%  Weight: 126 lb 11.2 oz (57.5 kg)    Body mass index is 21.25 kg/m.   Physical Exam Musculoskeletal:     Right shoulder: Normal. No swelling, deformity or tenderness. Normal range of motion.     Comments: Right arm, forearm and thenar eminence that is tender to touch.      Impression and Plan:  Right arm pain -     Meloxicam; Take 1 tablet (7.5 mg total) by mouth daily.  Dispense: 30 tablet; Refill: 0 -     MR CERVICAL SPINE WO CONTRAST; Future  -Concerned that this could be a cervical myelopathy, also not excluding a muscle spasm so I will give 10 days of meloxicam but will order MRI of the cervical spine.   Time  spent:30 minutes reviewing chart, interviewing and examining patient and formulating plan of care.     Chaya Jan, MD [facility]

## 2023-06-03 NOTE — Telephone Encounter (Signed)
 Noted.

## 2023-06-03 NOTE — Telephone Encounter (Signed)
 Copied from CRM [id number]. Topic: Clinical - Red Word Triage >> Jun 03, 2023  9:14 AM [name] wrote: Red Word that prompted transfer to Nurse Triage: Pain in right arm and hand, severe at times to where patient cannot brush teeth.  Chief Complaint: right arm pain x 1 month Symptoms: pain and swelling to right arm Frequency: constant, worse with use Pertinent Negatives: Patient denies cp, sob, red streak Disposition: [] ED /[] Urgent Care (no appt availability in office) / [x] Appointment(In office/virtual)/ []  [facility]/ [] Home Care/ [] Refused Recommended Disposition /[] [facility]/ []  Follow-up with PCP Additional Notes: per protocol apt made for today. Care advice given, denies questions; instructed to go to ER if becomes worse.   Reason for Disposition  [1] Arm pains with exertion (e.g., walking) AND [2] pain goes away on resting AND [3] not present now  Answer Assessment - Initial Assessment Questions 1. ONSET: "When did the pain start?"     Gradually getting worse over past month, shoulder and back area of right arm 2. LOCATION: "Where is the pain located?"     Right arm 3. PAIN: "How bad is the pain?" (Scale 1-10; or mild, moderate, severe)   - MILD (1-3): Doesn't interfere with normal activities.   - MODERATE (4-7): Interferes with normal activities (e.g., work or school) or awakens from sleep.   - SEVERE (8-10): Excruciating pain, unable to do any normal activities, unable to hold a cup of water.     "Severe at times" 4. WORK OR EXERCISE: "Has there been any recent work or exercise that involved this part of the body?"     No 5. CAUSE: "What do you think is causing the arm pain?"     Maybe pulled muscle, used light weights.  6. OTHER SYMPTOMS: "Do you have any other symptoms?" (e.g., neck pain, swelling, rash, fever, numbness, weakness)     Swelling by thumb; unable to brush teeth, 7. PREGNANCY: "Is there any chance you are pregnant?" "When was your last  menstrual period?"     na  Protocols used: Arm Pain-A-AH

## 2023-06-10 ENCOUNTER — Telehealth: Payer: Self-pay

## 2023-06-10 NOTE — Telephone Encounter (Signed)
 Patient is aware. Note sent to [facility].

## 2023-06-10 NOTE — Telephone Encounter (Signed)
 Copied from CRM [id number]. Topic: Clinical - Medication Question >> Jun 10, 2023  9:08 AM Kathryne Eriksson wrote: Reason for CRM: meloxicam (MOBIC) 7.5 MG tablet >> Jun 10, 2023  9:10 AM Kathryne Eriksson wrote: Patient states the medication isn't working and wants to know if the dosage could be increased and or is it okay for her to take ibuprofen along with this medication. Patient is requesting a call back at [phone]

## 2023-06-29 DIAGNOSIS — Z419 Encounter for procedure for purposes other than remedying health state, unspecified: Secondary | ICD-10-CM | POA: Diagnosis not present

## 2023-07-29 DIAGNOSIS — Z419 Encounter for procedure for purposes other than remedying health state, unspecified: Secondary | ICD-10-CM | POA: Diagnosis not present

## 2023-08-29 DIAGNOSIS — Z419 Encounter for procedure for purposes other than remedying health state, unspecified: Secondary | ICD-10-CM | POA: Diagnosis not present

## 2023-09-04 ENCOUNTER — Encounter: Payer: Self-pay | Admitting: Internal Medicine

## 2023-09-11 NOTE — Telephone Encounter (Signed)
 [name] 09/11/2023 9:41 AM I left [name] a message letting her know I am checking with both of her insurances to see if they need PA, AND WILL CALL HER BAC

## 2023-09-18 ENCOUNTER — Ambulatory Visit (HOSPITAL_COMMUNITY)

## 2023-09-28 DIAGNOSIS — Z419 Encounter for procedure for purposes other than remedying health state, unspecified: Secondary | ICD-10-CM | POA: Diagnosis not present

## 2023-10-29 DIAGNOSIS — Z419 Encounter for procedure for purposes other than remedying health state, unspecified: Secondary | ICD-10-CM | POA: Diagnosis not present

## 2023-11-29 DIAGNOSIS — Z419 Encounter for procedure for purposes other than remedying health state, unspecified: Secondary | ICD-10-CM | POA: Diagnosis not present

## 2023-12-02 ENCOUNTER — Other Ambulatory Visit: Payer: Self-pay | Admitting: Internal Medicine

## 2023-12-02 DIAGNOSIS — E785 Hyperlipidemia, unspecified: Secondary | ICD-10-CM

## 2024-01-20 ENCOUNTER — Encounter: Payer: Self-pay | Admitting: Radiology

## 2024-02-28 DIAGNOSIS — Z419 Encounter for procedure for purposes other than remedying health state, unspecified: Secondary | ICD-10-CM | POA: Diagnosis not present

## 2024-03-02 ENCOUNTER — Ambulatory Visit: Payer: Medicaid Other | Admitting: Nurse Practitioner
